# Patient Record
Sex: Male | Born: 1975 | Race: White | Hispanic: No | Marital: Married | Smoking: Never smoker
Health system: Southern US, Community
[De-identification: ages and names within clinical notes are randomized; demographics above are authoritative.]

## PROBLEM LIST (undated history)

## (undated) DIAGNOSIS — F909 Attention-deficit hyperactivity disorder, unspecified type: Secondary | ICD-10-CM

## (undated) DIAGNOSIS — J40 Bronchitis, not specified as acute or chronic: Secondary | ICD-10-CM

## (undated) DIAGNOSIS — K219 Gastro-esophageal reflux disease without esophagitis: Secondary | ICD-10-CM

## (undated) DIAGNOSIS — N200 Calculus of kidney: Secondary | ICD-10-CM

## (undated) DIAGNOSIS — F419 Anxiety disorder, unspecified: Secondary | ICD-10-CM

## (undated) DIAGNOSIS — Z87442 Personal history of urinary calculi: Secondary | ICD-10-CM

## (undated) HISTORY — DX: Gastro-esophageal reflux disease without esophagitis: K21.9

## (undated) HISTORY — PX: BELPHAROPTOSIS REPAIR: SHX369

---

## 2008-09-03 ENCOUNTER — Emergency Department: Payer: Self-pay | Admitting: Emergency Medicine

## 2008-10-17 ENCOUNTER — Emergency Department: Payer: Self-pay | Admitting: Emergency Medicine

## 2011-09-22 HISTORY — PX: EXTRACORPOREAL SHOCK WAVE LITHOTRIPSY: SHX1557

## 2013-12-11 ENCOUNTER — Ambulatory Visit: Payer: Self-pay | Admitting: Physician Assistant

## 2013-12-11 LAB — URINALYSIS, COMPLETE
BILIRUBIN, UR: NEGATIVE
Bacteria: NEGATIVE
GLUCOSE, UR: NEGATIVE mg/dL (ref 0–75)
Ketone: NEGATIVE
LEUKOCYTE ESTERASE: NEGATIVE
Nitrite: NEGATIVE
PH: 7 (ref 4.5–8.0)
Protein: 30
SQUAMOUS EPITHELIAL: NONE SEEN
Specific Gravity: 1.02 (ref 1.003–1.030)
WBC UR: NONE SEEN /HPF (ref 0–5)

## 2013-12-14 ENCOUNTER — Ambulatory Visit: Payer: Self-pay | Admitting: Urology

## 2015-01-09 ENCOUNTER — Ambulatory Visit
Admit: 2015-01-09 | Disposition: A | Discharge status: Home / Self Care | Payer: Self-pay | Attending: Family Medicine | Admitting: Family Medicine

## 2015-01-09 LAB — URINALYSIS, COMPLETE: Bacteria: NEGATIVE

## 2015-01-11 LAB — URINE CULTURE

## 2015-03-27 IMAGING — CT CT STONE STUDY
2 of 4 series · 17 of 46 positions shown, 19 images · non-contrast
Comparison: 09/03/2008 CT

CLINICAL DATA: 37-year-old male with pelvic and scrotal pain.

EXAM:
CT ABDOMEN AND PELVIS WITHOUT CONTRAST
TECHNIQUE: Multidetector CT imaging of the abdomen and pelvis was performed
following the standard protocol without IV contrast.

[Series 2: soft tissue · axial · 0.79mm/px · z∈[-1050,-570]mm · 14 of 106 slices shown, 16 images]
[im 5/106  soft-tissue]
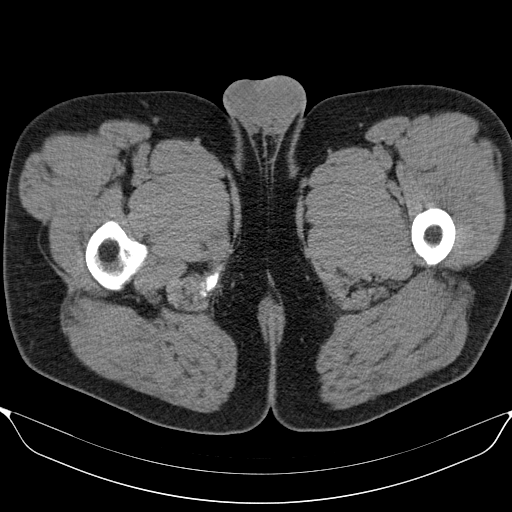
[im 5/106  bone]
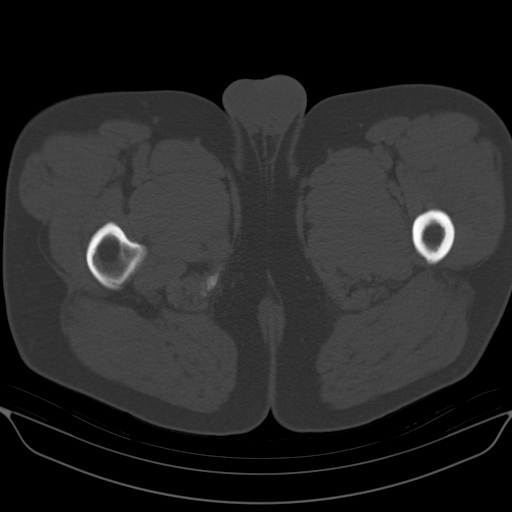
[im 13/106  soft-tissue]
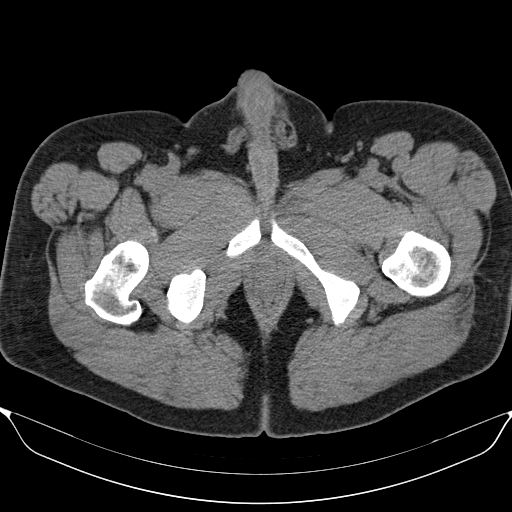
[im 22/106  soft-tissue]
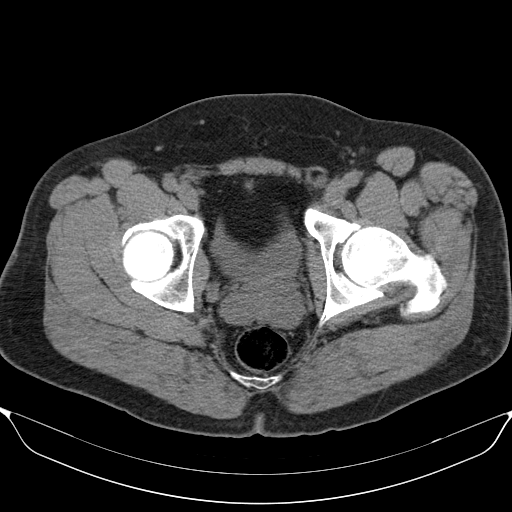
[im 30/106  soft-tissue]
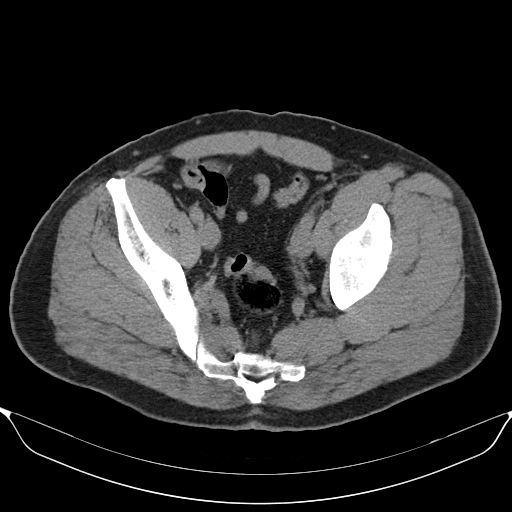
[im 34/106  soft-tissue]
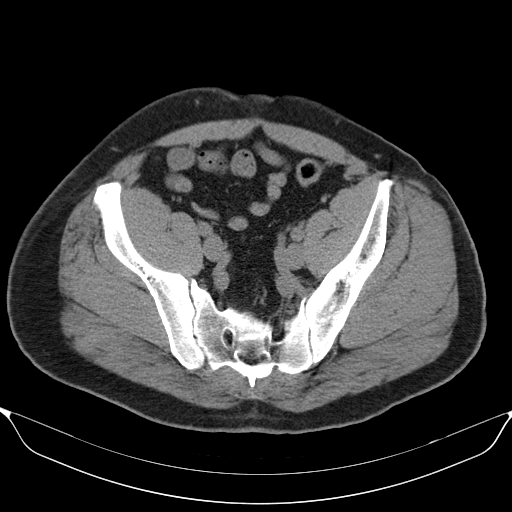
[im 43/106  soft-tissue]
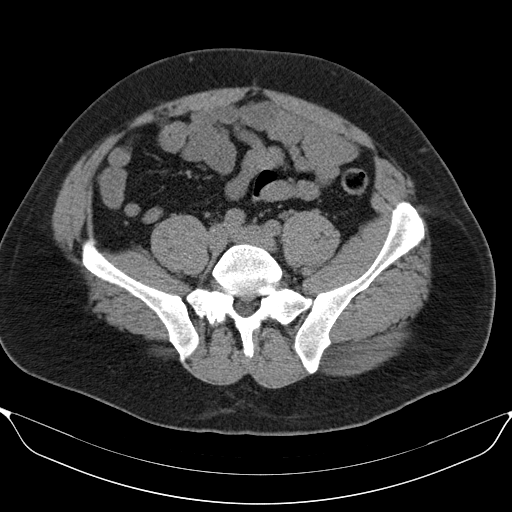
[im 51/106  soft-tissue]
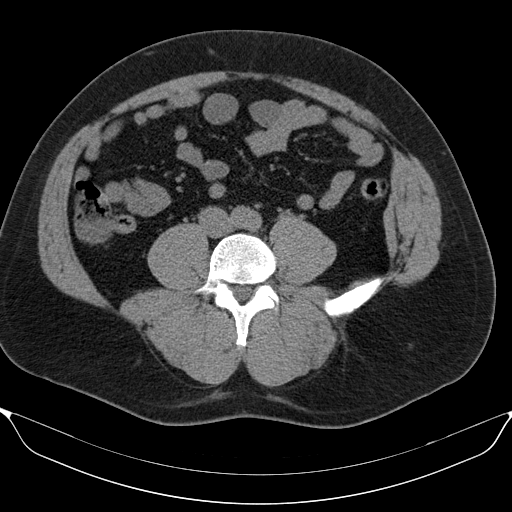
[im 55/106  soft-tissue]
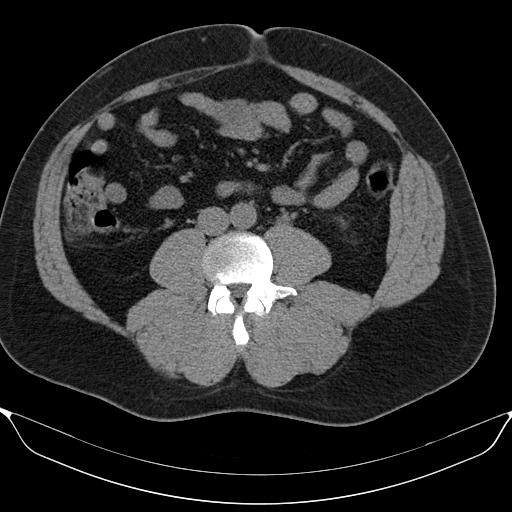
[im 64/106  soft-tissue]
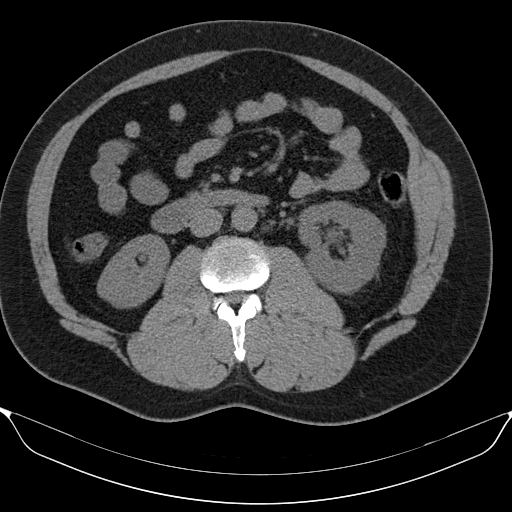
[im 64/106  bone]
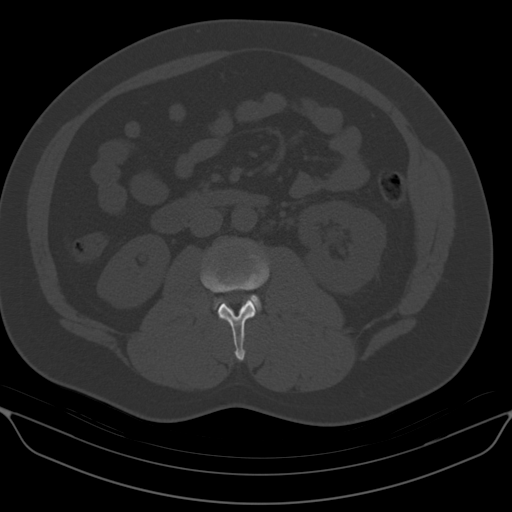
[im 72/106  soft-tissue]
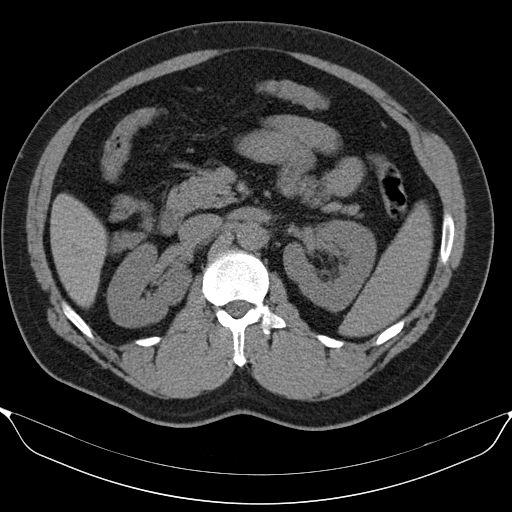
[im 80/106  soft-tissue]
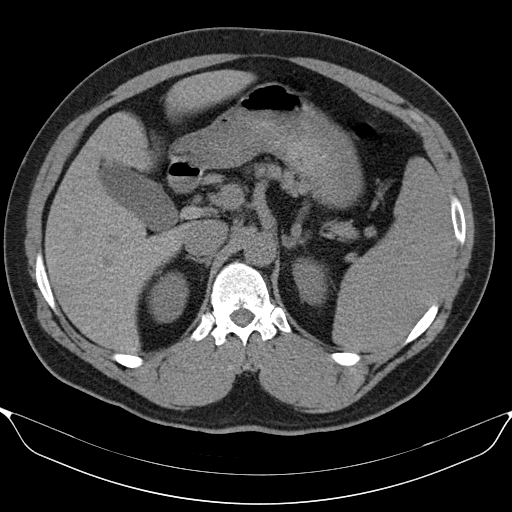
[im 85/106  soft-tissue]
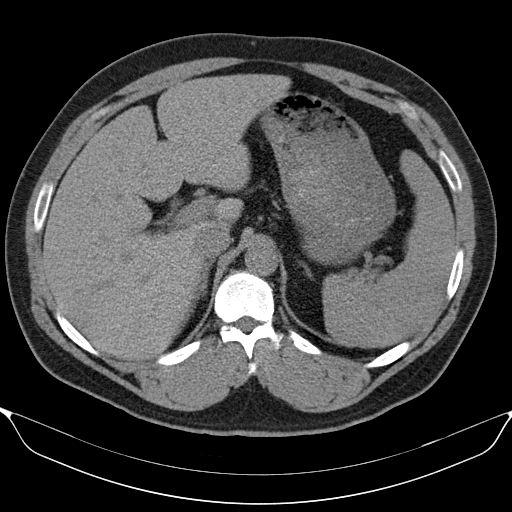
[im 93/106  soft-tissue]
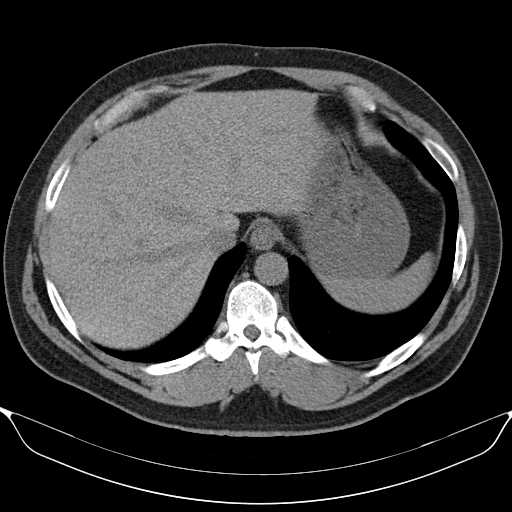
[im 101/106  soft-tissue]
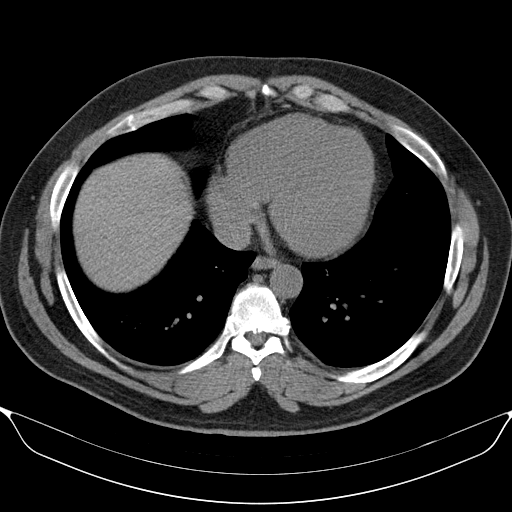

[Series 602: coronal · coronal · 1.02mm/px · 3 of 128 slices shown]
[im 43/128  soft-tissue]
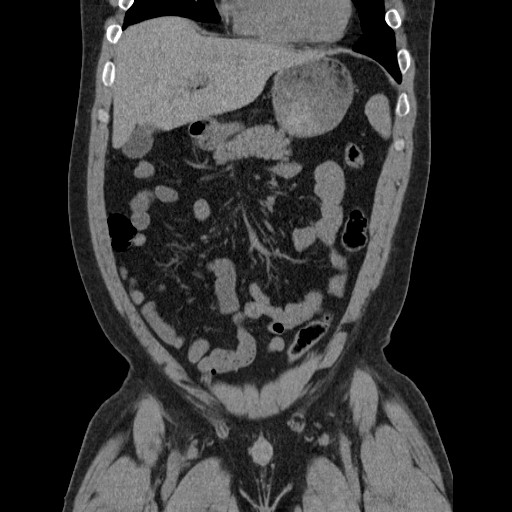
[im 57/128  soft-tissue]
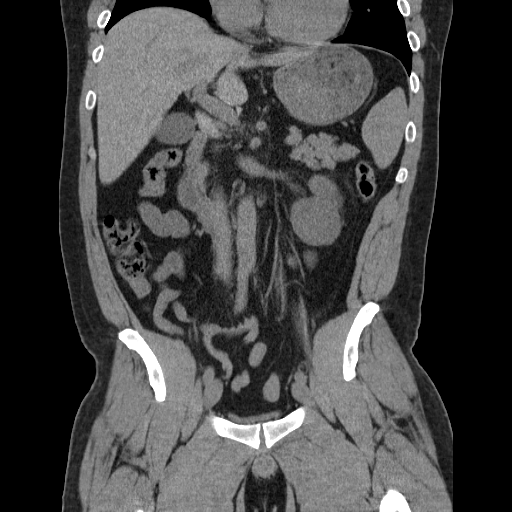
[im 71/128  soft-tissue]
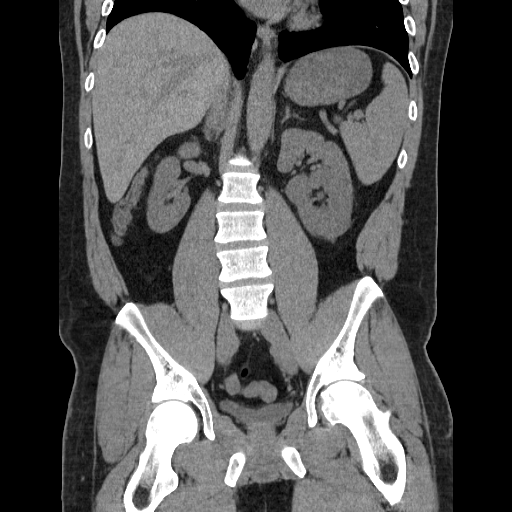

[17 of 46 positions shown; findings below may reference images not displayed]

FINDINGS: A 4 mm left UVJ calculus causes mild left hydroureteronephrosis.

The liver, adrenal glands, gallbladder, pancreas and right kidney
are unremarkable.

Mild splenomegaly again identified.

Please note that parenchymal abnormalities may be missed without
intravenous contrast.

The bowel, bladder and appendix are unremarkable. There is no
evidence of bowel obstruction, abscess or pneumoperitoneum.

No acute or suspicious bony abnormalities are noted. A mild apex
right lumbar scoliosis is present.
IMPRESSION: 4 mm left UVJ calculus causing mild left hydroureteronephrosis.

Unchanged mild splenomegaly.  .

## 2017-02-22 ENCOUNTER — Ambulatory Visit
Admission: EM | Admit: 2017-02-22 | Discharge: 2017-02-22 | Disposition: A | Discharge status: Home / Self Care | Payer: BLUE CROSS/BLUE SHIELD | Attending: Family Medicine | Admitting: Family Medicine

## 2017-02-22 DIAGNOSIS — R112 Nausea with vomiting, unspecified: Secondary | ICD-10-CM

## 2017-02-22 DIAGNOSIS — R197 Diarrhea, unspecified: Secondary | ICD-10-CM

## 2017-02-22 HISTORY — DX: Calculus of kidney: N20.0

## 2017-02-22 LAB — CBC WITH DIFFERENTIAL/PLATELET
BASOS ABS: 0.1 10*3/uL (ref 0–0.1)
Basophils Relative: 1 %
EOS ABS: 0.5 10*3/uL (ref 0–0.7)
EOS PCT: 5 %
HCT: 47.5 % (ref 40.0–52.0)
Hemoglobin: 16.6 g/dL (ref 13.0–18.0)
LYMPHS PCT: 11 %
Lymphs Abs: 1.1 10*3/uL (ref 1.0–3.6)
MCH: 33.9 pg (ref 26.0–34.0)
MCHC: 35 g/dL (ref 32.0–36.0)
MCV: 96.7 fL (ref 80.0–100.0)
Monocytes Absolute: 0.6 10*3/uL (ref 0.2–1.0)
Monocytes Relative: 6 %
Neutro Abs: 7.6 10*3/uL — ABNORMAL HIGH (ref 1.4–6.5)
Neutrophils Relative %: 77 %
PLATELETS: 180 10*3/uL (ref 150–440)
RBC: 4.91 MIL/uL (ref 4.40–5.90)
RDW: 13.1 % (ref 11.5–14.5)
WBC: 9.9 10*3/uL (ref 3.8–10.6)

## 2017-02-22 LAB — BASIC METABOLIC PANEL
ANION GAP: 7 (ref 5–15)
BUN: 14 mg/dL (ref 6–20)
CO2: 21 mmol/L — ABNORMAL LOW (ref 22–32)
Calcium: 9.3 mg/dL (ref 8.9–10.3)
Chloride: 105 mmol/L (ref 101–111)
Creatinine, Ser: 0.82 mg/dL (ref 0.61–1.24)
GFR calc Af Amer: >60 mL/min (ref >60)
Glucose, Bld: 98 mg/dL (ref 65–99)
POTASSIUM: 3.7 mmol/L (ref 3.5–5.1)
SODIUM: 133 mmol/L — AB (ref 135–145)

## 2017-02-22 LAB — C DIFFICILE QUICK SCREEN W PCR REFLEX
C DIFFICILE (CDIFF) TOXIN: NEGATIVE
C Diff antigen: NEGATIVE
C Diff interpretation: NOT DETECTED

## 2017-02-22 LAB — GASTROINTESTINAL PANEL BY PCR, STOOL (REPLACES STOOL CULTURE)
Adenovirus F40/41: NOT DETECTED
Astrovirus: NOT DETECTED
CAMPYLOBACTER SPECIES: NOT DETECTED
CRYPTOSPORIDIUM: NOT DETECTED
Cyclospora cayetanensis: NOT DETECTED
ENTEROPATHOGENIC E COLI (EPEC): NOT DETECTED
Entamoeba histolytica: NOT DETECTED
Enteroaggregative E coli (EAEC): NOT DETECTED
Enterotoxigenic E coli (ETEC): NOT DETECTED
Giardia lamblia: NOT DETECTED
Norovirus GI/GII: NOT DETECTED
PLESIMONAS SHIGELLOIDES: NOT DETECTED
ROTAVIRUS A: NOT DETECTED
SALMONELLA SPECIES: NOT DETECTED
SHIGELLA/ENTEROINVASIVE E COLI (EIEC): NOT DETECTED
Sapovirus (I, II, IV, and V): DETECTED — AB
Shiga like toxin producing E coli (STEC): NOT DETECTED
Vibrio cholerae: NOT DETECTED
Vibrio species: NOT DETECTED
YERSINIA ENTEROCOLITICA: NOT DETECTED

## 2017-02-22 MED ORDER — ONDANSETRON 4 MG PO TBDP: 4.0000 mg | ORAL_TABLET | Freq: Three times a day (TID) | ORAL | 0 refills | Status: DC | PRN

## 2017-02-22 NOTE — ED Provider Notes (Signed)
[facility] ____________________________________________  Time seen: Approximately 1:56 PM  I have reviewed the triage vital signs and the nursing notes.   HISTORY  Chief Complaint Nausea and Diarrhea   HPI Kenneth Gibson is a 41 y.o. male presents for evaluation of 2 months of intermittent diarrhea and nausea. Patient reports he has only vomited twice total. Patient states these episodes come and go. Patient states that these episodes do not last longer than 24 hours and he has had 4-5 episodes of this. Patient states that he is currently in an episode and has had multiple episodes of diarrhea today and some nausea. Denies any vomiting currently. Reports abdominal bubbly sensation but denies abdominal pain. Denies any abnormal colored stool, hematochezia, melena or other abnormal appearance. Denies blood in stool. Denies abdominal pain. Reports he has had one episode of having some chills and bodyaches, but denies any known fevers. Has not tried any over-the-counter medications for the same complaints.  Patient further reports that his wife and his daughter have also been having this same complaints in the same timeframe that come and go. Patient states that his youngest daughter is 78-month-old does not drink any household water and she has been asymptomatic. Patient states that this is raise concern that he may have the issue with his water and is currently having his water tested. Denies any recent sickness or antibiotic use. Denies any sore throat, cough or congestion. Reports continues to eat and drink well otherwise. Reports normal appetite. Denies worms in stool. Denies any other known triggers. Reports otherwise feels well.  Denies chest pain, shortness of breath, abdominal pain, dysuria, extremity pain, extremity swelling or rash. Denies recent sickness. Denies recent antibiotic use.     Past Medical History:  Diagnosis Date  . Kidney stones     There are  no active problems to display for this patient.   History reviewed. No pertinent surgical history.   No current facility-administered medications for this encounter.   Current Outpatient Prescriptions:  .  ondansetron (ZOFRAN ODT) 4 MG disintegrating tablet, Take 1 tablet (4 mg total) by mouth every 8 (eight) hours as needed., Disp: 15 tablet, Rfl: 0   Allergies Penicillins  History reviewed. No pertinent family history.  Social History Social History  Substance Use Topics  . Smoking status: Never Smoker  . Smokeless tobacco: Current User    Types: Snuff  . Alcohol use Yes     Comment: social    Review of Systems Constitutional: As above.  Eyes: No visual changes. ENT: No sore throat. Cardiovascular: Denies chest pain. Respiratory: Denies shortness of breath. Gastrointestinal: As above.  Genitourinary: Negative for dysuria. Musculoskeletal: Negative for back pain. Skin: Negative for rash.   ____________________________________________   PHYSICAL EXAM:  VITAL SIGNS: ED Triage Vitals  Enc Vitals Group     BP 02/22/17 1315 103/65     Pulse Rate 02/22/17 1315 86     Resp 02/22/17 1315 18     Temp 02/22/17 1315 98.4 F (36.9 C)     Temp Source 02/22/17 1315 Oral     SpO2 02/22/17 1315 99 %     Weight 02/22/17 1316 238 lb (108 kg)     Height 02/22/17 1316 5\' 9"  (1.753 m)     Head Circumference --      Peak Flow --      Pain Score 02/22/17 1311 5     Pain Loc --      Pain Edu? --  Excl. in GC? --     Constitutional: Alert and oriented. Well appearing and in no acute distress. ENT      Head: Normocephalic and atraumatic.      Mouth/Throat: Mucous membranes are moist.Oropharynx non-erythematous. Cardiovascular: Normal rate, regular rhythm. Grossly normal heart sounds.  Good peripheral circulation. Respiratory: Normal respiratory effort without tachypnea nor retractions. Breath sounds are clear and equal bilaterally. No wheezes, rales,  rhonchi. Gastrointestinal: Soft and nontender. No distention. Normal Bowel sounds. No CVA tenderness. Musculoskeletal: No midline cervical, thoracic or lumbar tenderness to palpation. Neurologic:  Normal speech and language. No gross focal neurologic deficits are appreciated. Speech is normal. No gait instability.  Skin:  Skin is warm, dry. Psychiatric: Mood and affect are normal. Speech and behavior are normal. Patient exhibits appropriate insight and judgment   ___________________________________________   LABS (all labs ordered are listed, but only abnormal results are displayed)  Labs Reviewed  CBC WITH DIFFERENTIAL/PLATELET - Abnormal; Notable for the following:       Result Value   Neutro Abs 7.6 (*)    All other components within normal limits  BASIC METABOLIC PANEL - Abnormal; Notable for the following:    Sodium 133 (*)    CO2 21 (*)    All other components within normal limits  GASTROINTESTINAL PANEL BY PCR, STOOL (REPLACES STOOL CULTURE)  C DIFFICILE QUICK SCREEN W PCR REFLEX   RADIOLOGY  No results found. ____________________________________________   PROCEDURES Procedures   INITIAL IMPRESSION / ASSESSMENT AND PLAN / ED COURSE  Pertinent labs & imaging results that were available during my care of the patient were reviewed by me and considered in my medical decision making (see chart for details).  Well-appearing patient. No acute distress. Presents for the complaints of diarrhea that has been going on intermittently for the last 2 months with other household contacts with similar. Denies any aggravating or alleviating factors. Reports has continued to eat and drink well. Reports slightly nauseated. Discussed with patient, patient appears well will evaluate CBC and BMP, labs reviewed. Will go ahead and evaluate for GI panel and C. difficile. We'll await panel prior to initiating any antibiotic therapy. Will Rx Zofran as needed. Encourage rest, fluids,  over-the-counter Imodium and supportive care. Discussed strict follow-up and return parameters.Discussed indication, risks and benefits of medications with patient.  Discussed follow up with Primary care physician this week. Discussed follow up and return parameters including no resolution or any worsening concerns. Patient verbalized understanding and agreed to plan.   ____________________________________________   FINAL CLINICAL IMPRESSION(S) / ED DIAGNOSES  Final diagnoses:  Diarrhea, unspecified type  Non-intractable vomiting with nausea, unspecified vomiting type     Discharge Medication List as of 02/22/2017  1:57 PM    START taking these medications   Details  ondansetron (ZOFRAN ODT) 4 MG disintegrating tablet Take 1 tablet (4 mg total) by mouth every 8 (eight) hours as needed., Starting Mon 02/22/2017, Normal        Note: This dictation was prepared with Dragon dictation along with smaller phrase technology. Any transcriptional errors that result from this process are unintentional.            Renford DillsMiller, Verdie Wilms, NP 02/22/17 1456

## 2017-02-22 NOTE — Discharge Instructions (Signed)
Take medication as prescribed. Rest. Drink plenty of fluids.  ° °Follow up with your primary care physician this week as needed. Return to Urgent care for new or worsening concerns.  ° °

## 2017-02-22 NOTE — ED Triage Notes (Addendum)
Pt reports for the past 2 months he has had diarrhea, nausea and abd pain (describes a bubbling) which lasts for about a day or two and then goes away, only to return weekly. His wife and daughter have had the same thing. He wonders if there is something in their water. Several loose and watery stools today with nausea, no vomiting (he reports he has vomited in the past with this).

## 2017-02-28 ENCOUNTER — Ambulatory Visit
Admission: EM | Admit: 2017-02-28 | Discharge: 2017-02-28 | Disposition: A | Discharge status: Home / Self Care | Payer: BLUE CROSS/BLUE SHIELD | Attending: Internal Medicine | Admitting: Internal Medicine

## 2017-02-28 ENCOUNTER — Encounter: Payer: Self-pay | Admitting: Gynecology

## 2017-02-28 DIAGNOSIS — W57XXXA Bitten or stung by nonvenomous insect and other nonvenomous arthropods, initial encounter: Secondary | ICD-10-CM | POA: Diagnosis not present

## 2017-02-28 DIAGNOSIS — S81851A Open bite, right lower leg, initial encounter: Secondary | ICD-10-CM

## 2017-02-28 DIAGNOSIS — Z23 Encounter for immunization: Secondary | ICD-10-CM

## 2017-02-28 MED ORDER — TETANUS-DIPHTH-ACELL PERTUSSIS 5-2.5-18.5 LF-MCG/0.5 IM SUSP
0.5000 mL | Freq: Once | INTRAMUSCULAR | Status: AC
  Administered 2017-02-28: 0.5 mL via INTRAMUSCULAR

## 2017-02-28 MED ORDER — SULFAMETHOXAZOLE-TRIMETHOPRIM 800-160 MG PO TABS: 1.0000 | ORAL_TABLET | Freq: Two times a day (BID) | ORAL | 0 refills | Status: DC

## 2017-02-28 MED ORDER — MUPIROCIN 2 % EX OINT: 1.0000 "application " | TOPICAL_OINTMENT | Freq: Three times a day (TID) | CUTANEOUS | 0 refills | Status: DC

## 2017-02-28 NOTE — ED Triage Notes (Signed)
Per patient while taking his trash  out last night got bitten on his right lower leg / ankle. Patient believe that it is a snake bite. Patient also request Tetanus vaccine.

## 2017-02-28 NOTE — ED Provider Notes (Signed)
CSN: [id number]     Arrival date & time 02/28/17  0941 History   First MD Initiated Contact with Patient 02/28/17 1149     Chief Complaint  Patient presents with  . Insect Bite   (Consider location/radiation/quality/duration/timing/severity/associated sxs/prior Treatment) HPI  This 41 year old male who presents with foot swelling and pain after he was bitten last night when he took out his garbage.. About  10:30 11:00 last night he was taking his garbage out and was barefoot at the time. He remembers having something bite his ankle just inferior to the lateral malleolus. He rushed back in the house to find a flashlight but when he returned could not find anything at all. He thinks it may have been a snake, but is not certain. He states that it is hurting to walk on it but his had no systemic complaints. He says he feels fine. Is some swelling of the lateral foot and ankle. There is no tissue breakdown at all. The swelling does not extend above the malleolus. He has 2 small puncture wounds on the side of the foot. There is no induration or erythema no drainage.        Past Medical History:  Diagnosis Date  . Kidney stones    History reviewed. No pertinent surgical history. No family history on file. Social History  Substance Use Topics  . Smoking status: Never Smoker  . Smokeless tobacco: Current User    Types: Snuff  . Alcohol use Yes     Comment: social    Review of Systems  Constitutional: Positive for activity change. Negative for chills, fatigue and fever.  Musculoskeletal: Positive for gait problem.  Skin: Positive for wound. Negative for color change and pallor.  All other systems reviewed and are negative.   Allergies  Penicillins  Home Medications   Prior to Admission medications   Medication Sig Start Date End Date Taking? Authorizing Provider  ondansetron (ZOFRAN ODT) 4 MG disintegrating tablet Take 1 tablet (4 mg total) by mouth every 8 (eight) hours as  needed. 02/22/17  Yes Renford Dills, NP  mupirocin ointment (BACTROBAN) 2 % Apply 1 application topically 3 (three) times daily. 02/28/17   Lutricia Feil, PA-C   Meds Ordered and Administered this Visit   Medications  Tdap (BOOSTRIX) injection 0.5 mL (0.5 mLs Intramuscular Given 02/28/17 1111)    BP 120/74 (BP Location: Left Arm)   Pulse 73   Temp 98.3 F (36.8 C) (Oral)   Resp 16   Wt 238 lb (108 kg)   SpO2 98%   BMI 35.15 kg/m  No data found.   Physical Exam  Constitutional: He is oriented to person, place, and time. He appears well-developed and well-nourished. No distress.  HENT:  Head: Normocephalic and atraumatic.  Eyes: EOM are normal. Pupils are equal, round, and reactive to light. Right eye exhibits no discharge. Left eye exhibits no discharge.  Neck: Normal range of motion.  Musculoskeletal: Normal range of motion. He exhibits edema and tenderness.  Neurological: He is alert and oriented to person, place, and time.  Skin: Skin is warm and dry. Capillary refill takes less than 2 seconds. He is not diaphoretic. No erythema. No pallor.  Psychiatric: He has a normal mood and affect. His behavior is normal. Judgment and thought content normal.  Nursing note and vitals reviewed.       Urgent Care Course     Procedures (including critical care time)  Labs Review Labs Reviewed - No data to  display  Imaging Review No results found.   Visual Acuity Review  Right Eye Distance:   Left Eye Distance:   Bilateral Distance:    Right Eye Near:   Left Eye Near:    Bilateral Near:     Medications  Tdap (BOOSTRIX) injection 0.5 mL (0.5 mLs Intramuscular Given 02/28/17 1111)      MDM   1. Insect bite, initial encounter    Discharge Medication List as of 02/28/2017 11:55 AM    START taking these medications   Details  mupirocin ointment (BACTROBAN) 2 % Apply 1 application topically 3 (three) times daily., Starting Sun 02/28/2017, Normal      Plan: 1.  Test/x-ray results and diagnosis reviewed with patient 2. rx as per orders; risks, benefits, potential side effects reviewed with patient 3. Recommend supportive treatment with Elevation ice. Careful observation of any worsening. If there is any question at all he should call 911 and go to the emergency department. Is been over 12 hours since the alleged bite. There is no tissue breakdown the swelling is very localized to the foot and ankle. He is hemodynamically stable. Patient does not have any pain or does not feel badly at all. I have told him that today he should elevate and ice it all of today most of tomorrow. I've given him Bactroban for topical application to prevent superficial infection. He knows to contact 911 if there is any change in his status. 4. F/u prn if symptoms worsen or don't improve     Lutricia FeilRoemer, Levell Tavano P, PA-C 02/28/17 1204    Lutricia Feiloemer, Omeka Holben P, PA-C 02/28/17 1205

## 2020-10-14 ENCOUNTER — Other Ambulatory Visit
Admission: RE | Admit: 2020-10-14 | Discharge: 2020-10-14 | Disposition: A | Discharge status: Home / Self Care | Payer: BLUE CROSS/BLUE SHIELD | Source: Ambulatory Visit | Attending: Specialist | Admitting: Specialist

## 2020-10-14 ENCOUNTER — Other Ambulatory Visit: Payer: Self-pay | Admitting: Specialist

## 2020-10-14 DIAGNOSIS — Z01812 Encounter for preprocedural laboratory examination: Secondary | ICD-10-CM | POA: Insufficient documentation

## 2020-10-14 DIAGNOSIS — Z880 Allergy status to penicillin: Secondary | ICD-10-CM | POA: Diagnosis not present

## 2020-10-14 DIAGNOSIS — Z20822 Contact with and (suspected) exposure to covid-19: Secondary | ICD-10-CM | POA: Insufficient documentation

## 2020-10-14 DIAGNOSIS — M67432 Ganglion, left wrist: Secondary | ICD-10-CM | POA: Diagnosis not present

## 2020-10-14 HISTORY — DX: Anxiety disorder, unspecified: F41.9

## 2020-10-14 HISTORY — DX: Attention-deficit hyperactivity disorder, unspecified type: F90.9

## 2020-10-14 HISTORY — DX: Personal history of urinary calculi: Z87.442

## 2020-10-14 NOTE — Patient Instructions (Addendum)
Your procedure is scheduled on: 10/17/20 Report to the Registration Desk on the 1st floor of the [facility]. To find out your arrival time, please call [phone] between 1PM - 3PM on: 10/16/20  REMEMBER: Instructions that are not followed completely may result in serious medical risk, up to and including death; or upon the discretion of your surgeon and anesthesiologist your surgery may need to be rescheduled.  Do not eat food after midnight the night before surgery.  No gum chewing, lozengers or hard candies.  You may however, drink CLEAR liquids up to 2 hours before you are scheduled to arrive for your surgery. Do not drink anything within 2 hours of your scheduled arrival time.  Clear liquids include: - water  - apple juice without pulp - gatorade (not RED, PURPLE, OR BLUE) - black coffee or tea (Do NOT add milk or creamers to the coffee or tea) Do NOT drink anything that is not on this list.   TAKE THESE MEDICATIONS THE MORNING OF SURGERY WITH A SIP OF WATER: NA  One week prior to surgery: Stop Anti-inflammatories (NSAIDS) such as Advil, Aleve, Ibuprofen, Motrin, Naproxen, Naprosyn and Aspirin based products such as Excedrin, Goodys Powder, BC Powder. Stop ANY OVER THE COUNTER supplements until after surgery. CBD GUMMIES (However, you may continue taking Vitamin D, Vitamin B, and multivitamin up until the day before surgery.)  No Alcohol for 24 hours before or after surgery.  No Smoking including e-cigarettes for 24 hours prior to surgery.  No chewable tobacco products for at least 6 hours prior to surgery.  No nicotine patches on the day of surgery.  Do not use any "recreational" drugs for at least a week prior to your surgery.  Please be advised that the combination of cocaine and anesthesia may have negative outcomes, up to and including death. If you test positive for cocaine, your surgery will be cancelled.  On the morning of surgery brush your teeth with  toothpaste and water, you may rinse your mouth with mouthwash if you wish. Do not swallow any toothpaste or mouthwash.  Do not wear jewelry, make-up, hairpins, clips or nail polish.  Do not wear lotions, powders, or perfumes.   Do not shave body from the neck down 48 hours prior to surgery just in case you cut yourself which could leave a site for infection.  Also, freshly shaved skin may become irritated if using the CHG soap.  Contact lenses, hearing aids and dentures may not be worn into surgery.  Do not bring valuables to the hospital. [facility] is not responsible for any missing/lost belongings or valuables.   Use CHG Soap or wipes as directed on instruction sheet.   Notify your doctor if there is any change in your medical condition (cold, fever, infection).  Wear comfortable clothing (specific to your surgery type) to the hospital.  Plan for stool softeners for home use; pain medications have a tendency to cause constipation. You can also help prevent constipation by eating foods high in fiber such as fruits and vegetables and drinking plenty of fluids as your diet allows.  After surgery, you can help prevent lung complications by doing breathing exercises.  Take deep breaths and cough every 1-2 hours. Your doctor may order a device called an Incentive Spirometer to help you take deep breaths. When coughing or sneezing, hold a pillow firmly against your incision with both hands. This is called "splinting." Doing this helps protect your incision. It also decreases belly discomfort.  If you are being admitted to the hospital overnight, leave your suitcase in the car. After surgery it may be brought to your room.  If you are being discharged the day of surgery, you will not be allowed to drive home. You will need a responsible adult (18 years or older) to drive you home and stay with you that night.   If you are taking public transportation, you will need to have a responsible  adult (18 years or older) with you. Please confirm with your physician that it is acceptable to use public transportation.   Please call the Pre-admissions Testing Dept. at [phone] if you have any questions about these instructions.  Visitation Policy:  Patients undergoing a surgery or procedure may have one family member or support person with them as long as that person is not COVID-19 positive or experiencing its symptoms.  That person may remain in the waiting area during the procedure.  Inpatient Visitation:    Visiting hours are 7 a.m. to 8 p.m. Patients will be allowed one visitor. The visitor may change daily. The visitor must pass COVID-19 screenings, use hand sanitizer when entering and exiting the patient's room and wear a mask at all times, including in the patient's room. Patients must also wear a mask when staff or their visitor are in the room. Masking is required regardless of vaccination status. Systemwide, no visitors 17 or younger.

## 2020-10-14 NOTE — H&P (Signed)
PREOPERATIVE H&P  Chief Complaint: [phone] Ganglion, left wrist volar  HPI: Kenneth Gibson is a 45 y.o. male who presents for preoperative history and physical with a diagnosis of M67.432 Ganglion, left wrist. Symptoms are rated as moderate to severe, and have been worsening.  This is significantly impairing activities of daily living.  He has elected for surgical management.   Past Medical History:  Diagnosis Date  . ADHD   . Anxiety   . History of kidney stones   . Kidney stones    Past Surgical History:  Procedure Laterality Date  . BELPHAROPTOSIS REPAIR     Social History   Socioeconomic History  . Marital status: Married    Spouse name: Not on file  . Number of children: 2  . Years of education: Not on file  . Highest education level: Not on file  Occupational History  . Not on file  Tobacco Use  . Smoking status: Never Smoker  . Smokeless tobacco: Current User    Types: Snuff  Substance and Sexual Activity  . Alcohol use: Yes    Comment: social  . Drug use: No  . Sexual activity: Not on file  Other Topics Concern  . Not on file  Social History Narrative  . Not on file   Social Determinants of Health   Financial Resource Strain: Not on file  Food Insecurity: Not on file  Transportation Needs: Not on file  Physical Activity: Not on file  Stress: Not on file  Social Connections: Not on file   No family history on file. Allergies  Allergen Reactions  . Penicillins Other (See Comments)    Unknown reaction. Had as a child   Prior to Admission medications   Not on File     Positive ROS: All other systems have been reviewed and were otherwise negative with the exception of those mentioned in the HPI and as above.  Physical Exam: General: Alert, no acute distress Cardiovascular: No pedal edema. Heart is regular and without murmur.  Respiratory: No cyanosis, no use of accessory musculature. Lungs are clear. GI: No organomegaly, abdomen is soft  and non-tender Skin: No lesions in the area of chief complaint Neurologic: Sensation intact distally Psychiatric: Patient is competent for consent with normal mood and affect Lymphatic: No axillary or cervical lymphadenopathy  MUSCULOSKELETAL: volar ganglion left wrist over radial artery.  Tender to touch.  Mobile.  CSM ggod distally and skin intact.  Assessment: M67.432 Ganglion, left wrist  Plan: Plan for Procedure(s): REMOVAL GANGLION OF WRIST  The risks benefits and alternatives were discussed with the patient including but not limited to the risks of nonoperative treatment, versus surgical intervention including infection, bleeding, nerve injury,  blood clots, cardiopulmonary complications, morbidity, mortality, among others, and they were willing to proceed.   Valinda Hoar, MD [phone]   10/14/2020 12:07 PM

## 2020-10-15 ENCOUNTER — Other Ambulatory Visit: Payer: Self-pay

## 2020-10-15 ENCOUNTER — Other Ambulatory Visit
Admission: RE | Admit: 2020-10-15 | Discharge: 2020-10-15 | Disposition: A | Discharge status: Home / Self Care | Payer: BLUE CROSS/BLUE SHIELD | Source: Ambulatory Visit | Attending: Specialist | Admitting: Specialist

## 2020-10-15 DIAGNOSIS — M67432 Ganglion, left wrist: Secondary | ICD-10-CM | POA: Diagnosis not present

## 2020-10-15 LAB — SARS CORONAVIRUS 2 (TAT 6-24 HRS): SARS Coronavirus 2: NEGATIVE

## 2020-10-17 ENCOUNTER — Encounter
Admission: RE | Disposition: A | Discharge status: Home / Self Care | Payer: Self-pay | Source: Home / Self Care | Attending: Specialist

## 2020-10-17 ENCOUNTER — Ambulatory Visit: Payer: BLUE CROSS/BLUE SHIELD | Admitting: Urgent Care

## 2020-10-17 ENCOUNTER — Ambulatory Visit
Admission: RE | Admit: 2020-10-17 | Discharge: 2020-10-17 | Disposition: A | Discharge status: Home / Self Care | Payer: BLUE CROSS/BLUE SHIELD | Attending: Specialist | Admitting: Specialist

## 2020-10-17 ENCOUNTER — Encounter: Payer: Self-pay | Admitting: Specialist

## 2020-10-17 ENCOUNTER — Other Ambulatory Visit: Payer: Self-pay

## 2020-10-17 DIAGNOSIS — Z20822 Contact with and (suspected) exposure to covid-19: Secondary | ICD-10-CM | POA: Insufficient documentation

## 2020-10-17 DIAGNOSIS — Z880 Allergy status to penicillin: Secondary | ICD-10-CM | POA: Insufficient documentation

## 2020-10-17 DIAGNOSIS — M67432 Ganglion, left wrist: Secondary | ICD-10-CM | POA: Insufficient documentation

## 2020-10-17 HISTORY — PX: GANGLION CYST EXCISION: SHX1691

## 2020-10-17 SURGERY — EXCISION, GANGLION CYST, WRIST
Anesthesia: General | Site: Wrist | Laterality: Left

## 2020-10-17 MED ORDER — MIDAZOLAM HCL 2 MG/2ML IJ SOLN
INTRAMUSCULAR | Status: DC | PRN
  Administered 2020-10-17: 2 mg via INTRAVENOUS

## 2020-10-17 MED ORDER — FAMOTIDINE 20 MG PO TABS: 20.0000 mg | ORAL_TABLET | Freq: Once | ORAL | Status: AC

## 2020-10-17 MED ORDER — CHLORHEXIDINE GLUCONATE 0.12 % MT SOLN
OROMUCOSAL | Status: AC
  Administered 2020-10-17: 15 mL via OROMUCOSAL
  Filled 2020-10-17: qty 15

## 2020-10-17 MED ORDER — ORAL CARE MOUTH RINSE: 15.0000 mL | Freq: Once | OROMUCOSAL | Status: AC

## 2020-10-17 MED ORDER — GABAPENTIN 300 MG PO CAPS
ORAL_CAPSULE | ORAL | Status: AC
  Administered 2020-10-17: 300 mg via ORAL
  Filled 2020-10-17: qty 1

## 2020-10-17 MED ORDER — FENTANYL CITRATE (PF) 100 MCG/2ML IJ SOLN
INTRAMUSCULAR | Status: AC
  Filled 2020-10-17: qty 2

## 2020-10-17 MED ORDER — ONDANSETRON HCL 4 MG/2ML IJ SOLN
INTRAMUSCULAR | Status: DC | PRN
  Administered 2020-10-17: 4 mg via INTRAVENOUS

## 2020-10-17 MED ORDER — CHLORHEXIDINE GLUCONATE 0.12 % MT SOLN: 15.0000 mL | Freq: Once | OROMUCOSAL | Status: AC

## 2020-10-17 MED ORDER — DEXAMETHASONE SODIUM PHOSPHATE 10 MG/ML IJ SOLN
INTRAMUSCULAR | Status: AC
  Filled 2020-10-17: qty 1

## 2020-10-17 MED ORDER — DEXAMETHASONE SODIUM PHOSPHATE 10 MG/ML IJ SOLN
INTRAMUSCULAR | Status: DC | PRN
  Administered 2020-10-17: 10 mg via INTRAVENOUS
  Administered 2020-10-17: 4 mg via INTRAVENOUS

## 2020-10-17 MED ORDER — LIDOCAINE HCL (PF) 2 % IJ SOLN
INTRAMUSCULAR | Status: AC
  Filled 2020-10-17: qty 5

## 2020-10-17 MED ORDER — MELOXICAM 15 MG PO TABS: 15.0000 mg | ORAL_TABLET | Freq: Every day | ORAL | 3 refills | Status: DC

## 2020-10-17 MED ORDER — LIDOCAINE HCL (CARDIAC) PF 100 MG/5ML IV SOSY
PREFILLED_SYRINGE | INTRAVENOUS | Status: DC | PRN
  Administered 2020-10-17: 100 mg via INTRAVENOUS

## 2020-10-17 MED ORDER — LACTATED RINGERS IV SOLN: INTRAVENOUS | Status: DC

## 2020-10-17 MED ORDER — HYDROCODONE-ACETAMINOPHEN 5-325 MG PO TABS: 1.0000 | ORAL_TABLET | Freq: Four times a day (QID) | ORAL | 0 refills | Status: DC | PRN

## 2020-10-17 MED ORDER — BUPIVACAINE HCL (PF) 0.5 % IJ SOLN
INTRAMUSCULAR | Status: AC
  Filled 2020-10-17: qty 30

## 2020-10-17 MED ORDER — CLINDAMYCIN PHOSPHATE 600 MG/50ML IV SOLN
INTRAVENOUS | Status: AC
  Filled 2020-10-17: qty 50

## 2020-10-17 MED ORDER — CEFAZOLIN SODIUM-DEXTROSE 2-4 GM/100ML-% IV SOLN
2.0000 g | INTRAVENOUS | Status: AC
  Administered 2020-10-17: 2 g via INTRAVENOUS

## 2020-10-17 MED ORDER — CEFAZOLIN SODIUM-DEXTROSE 2-4 GM/100ML-% IV SOLN
INTRAVENOUS | Status: AC
  Filled 2020-10-17: qty 100

## 2020-10-17 MED ORDER — FENTANYL CITRATE (PF) 100 MCG/2ML IJ SOLN
INTRAMUSCULAR | Status: DC | PRN
  Administered 2020-10-17: 50 ug via INTRAVENOUS

## 2020-10-17 MED ORDER — FAMOTIDINE 20 MG PO TABS
ORAL_TABLET | ORAL | Status: AC
  Administered 2020-10-17: 20 mg via ORAL
  Filled 2020-10-17: qty 1

## 2020-10-17 MED ORDER — GABAPENTIN 300 MG PO CAPS: 300.0000 mg | ORAL_CAPSULE | ORAL | Status: AC

## 2020-10-17 MED ORDER — BUPIVACAINE HCL 0.5 % IJ SOLN
INTRAMUSCULAR | Status: DC | PRN
  Administered 2020-10-17: 9 mL

## 2020-10-17 MED ORDER — MELOXICAM 7.5 MG PO TABS: 15.0000 mg | ORAL_TABLET | ORAL | Status: AC

## 2020-10-17 MED ORDER — MIDAZOLAM HCL 2 MG/2ML IJ SOLN
INTRAMUSCULAR | Status: AC
  Filled 2020-10-17: qty 2

## 2020-10-17 MED ORDER — EPHEDRINE SULFATE 50 MG/ML IJ SOLN
INTRAMUSCULAR | Status: DC | PRN
  Administered 2020-10-17: 10 mg via INTRAVENOUS

## 2020-10-17 MED ORDER — MELOXICAM 7.5 MG PO TABS
ORAL_TABLET | ORAL | Status: AC
  Administered 2020-10-17: 15 mg via ORAL
  Filled 2020-10-17: qty 2

## 2020-10-17 MED ORDER — CLINDAMYCIN PHOSPHATE 600 MG/50ML IV SOLN
600.0000 mg | INTRAVENOUS | Status: AC
  Administered 2020-10-17: 600 mg via INTRAVENOUS

## 2020-10-17 MED ORDER — CHLORHEXIDINE GLUCONATE CLOTH 2 % EX PADS: 6.0000 | MEDICATED_PAD | Freq: Once | CUTANEOUS | Status: DC

## 2020-10-17 MED ORDER — GABAPENTIN 400 MG PO CAPS: 400.0000 mg | ORAL_CAPSULE | Freq: Three times a day (TID) | ORAL | 3 refills | Status: DC

## 2020-10-17 MED ORDER — EPHEDRINE 5 MG/ML INJ
INTRAVENOUS | Status: AC
  Filled 2020-10-17: qty 10

## 2020-10-17 MED ORDER — PROPOFOL 10 MG/ML IV BOLUS
INTRAVENOUS | Status: DC | PRN
Start: 2020-10-17 — End: 2020-10-17
  Administered 2020-10-17: 200 mg via INTRAVENOUS

## 2020-10-17 MED ORDER — ONDANSETRON HCL 4 MG/2ML IJ SOLN
INTRAMUSCULAR | Status: AC
  Filled 2020-10-17: qty 2

## 2020-10-17 MED ORDER — PROPOFOL 10 MG/ML IV BOLUS
INTRAVENOUS | Status: AC
  Filled 2020-10-17: qty 40

## 2020-10-17 SURGICAL SUPPLY — 35 items
APL PRP STRL LF DISP 70% ISPRP (MISCELLANEOUS) ×1
BLADE SURG MINI STRL (BLADE) ×2 IMPLANT
BNDG ESMARK 4X12 TAN STRL LF (GAUZE/BANDAGES/DRESSINGS) ×2 IMPLANT
CANISTER SUCT 1200ML W/VALVE (MISCELLANEOUS) ×2 IMPLANT
CHLORAPREP W/TINT 26 (MISCELLANEOUS) ×2 IMPLANT
COVER WAND RF STERILE (DRAPES) ×2 IMPLANT
CUFF TOURN SGL QUICK 18X4 (TOURNIQUET CUFF) ×1 IMPLANT
DRSG GAUZE FLUFF 36X18 (GAUZE/BANDAGES/DRESSINGS) ×3 IMPLANT
ELECT REM PT RETURN 9FT ADLT (ELECTROSURGICAL) ×2
ELECTRODE REM PT RTRN 9FT ADLT (ELECTROSURGICAL) ×1 IMPLANT
GAUZE SPONGE 4X4 12PLY STRL (GAUZE/BANDAGES/DRESSINGS) ×2 IMPLANT
GAUZE XEROFORM 1X8 LF (GAUZE/BANDAGES/DRESSINGS) ×2 IMPLANT
GLOVE SURG ORTHO LTX SZ8 (GLOVE) ×2 IMPLANT
GOWN STRL REUS W/ TWL LRG LVL3 (GOWN DISPOSABLE) ×1 IMPLANT
GOWN STRL REUS W/TWL LRG LVL3 (GOWN DISPOSABLE) ×2
GOWN STRL REUS W/TWL LRG LVL4 (GOWN DISPOSABLE) ×2 IMPLANT
KIT TURNOVER KIT A (KITS) IMPLANT
MANIFOLD NEPTUNE II (INSTRUMENTS) ×2 IMPLANT
NS IRRIG 500ML POUR BTL (IV SOLUTION) ×2 IMPLANT
PACK EXTREMITY ARMC (MISCELLANEOUS) ×2 IMPLANT
PAD CAST CTTN 4X4 STRL (SOFTGOODS) ×1 IMPLANT
PAD PREP 24X41 OB/GYN DISP (PERSONAL CARE ITEMS) ×2 IMPLANT
PADDING CAST COTTON 4X4 STRL (SOFTGOODS) ×2
SPLINT CAST 1 STEP 3X12 (MISCELLANEOUS) ×2 IMPLANT
STOCKINETTE 48X4 2 PLY STRL (GAUZE/BANDAGES/DRESSINGS) ×1 IMPLANT
STOCKINETTE BIAS CUT 4 980044 (GAUZE/BANDAGES/DRESSINGS) ×2 IMPLANT
STOCKINETTE STRL 4IN 9604848 (GAUZE/BANDAGES/DRESSINGS) ×2 IMPLANT
STRAP SAFETY 5IN WIDE (MISCELLANEOUS) ×2 IMPLANT
SUT ETHILON 4-0 (SUTURE) ×2
SUT ETHILON 4-0 FS2 18XMFL BLK (SUTURE) ×1
SUT ETHILON 5-0 (SUTURE) ×2
SUT ETHILON 5-0 C-3 18XMFL BLK (SUTURE) ×1
SUT VIC AB 4-0 FS2 27 (SUTURE) ×2 IMPLANT
SUTURE ETHLN 4-0 FS2 18XMF BLK (SUTURE) ×1 IMPLANT
SUTURE ETHLN 5-0 C3 18XMF BLK (SUTURE) ×1 IMPLANT

## 2020-10-17 NOTE — Anesthesia Procedure Notes (Signed)
Procedure Name: LMA Insertion Date/Time: 10/17/2020 8:21 AM Performed by: Reece Agar, CRNA Pre-anesthesia Checklist: Patient identified, Emergency Drugs available, Suction available and Patient being monitored Patient Re-evaluated:Patient Re-evaluated prior to induction Oxygen Delivery Method: Circle system utilized Preoxygenation: Pre-oxygenation with 100% oxygen Induction Type: IV induction LMA: LMA inserted LMA Size: 5.0 Number of attempts: 1 Placement Confirmation: positive ETCO2 and breath sounds checked- equal and bilateral Tube secured with: Tape Dental Injury: Teeth and Oropharynx as per pre-operative assessment

## 2020-10-17 NOTE — Transfer of Care (Signed)
Immediate Anesthesia Transfer of Care Note  Patient: Kenneth Gibson  Procedure(s) Performed: REMOVAL GANGLION OF WRIST (Left Wrist)  Patient Location: PACU  Anesthesia Type:General  Level of Consciousness: sedated  Airway & Oxygen Therapy: Patient Spontanous Breathing and Patient connected to face mask  Post-op Assessment: Report given to RN and Post -op Vital signs reviewed and stable  Post vital signs: Reviewed and stable  Last Vitals:  Vitals Value Taken Time  BP 111/63 10/17/20 0853  Temp 36.2 C 10/17/20 0855  Pulse 67 10/17/20 0859  Resp 12 10/17/20 0859  SpO2 98 % 10/17/20 0859  Vitals shown include unvalidated device data.  Last Pain:  Vitals:   10/17/20 0648  TempSrc: Oral  PainSc: 0-No pain         Complications: No complications documented.

## 2020-10-17 NOTE — Op Note (Signed)
10/17/2020  8:50 AM  PATIENT:  Kenneth Gibson    PRE-OPERATIVE DIAGNOSIS:  L24.401 Ganglion, left wrist  POST-OPERATIVE DIAGNOSIS:  Same  PROCEDURE:  REMOVAL GANGLION OF WRIST  SURGEON:  Valinda Hoar, MD  ANESTHESIA:   General  TOURNIQUET TIME: 21  MIN  PREOPERATIVE INDICATIONS:  Kenneth Gibson is a  45 y.o. male with a diagnosis of M67.432 Ganglion, left wrist who failed conservative measures and elected for surgical management.    The risks benefits and alternatives were discussed with the patient preoperatively including but not limited to the risks of infection, bleeding, nerve injury, cardiopulmonary complications, the need for revision surgery, among others, and the patient was willing to proceed.   OPERATIVE FINDINGS: Deep volar ganglion.  Artery intact  .  OPERATIVE PROCEDURE: The patient was brought to the operating room where they underwent satisfactory general LMA anesthesia.  The  arm was prepped and draped in sterile fashion.  Esmarch was applied and tourniquet inflated to 250 mmHg.  A short transverse incision was made over the volar radial aspect of the  wrist.  Dissection was carried out carefully under loupe magnification with fine scissors.  The ganglion was quickly evident and dissection was carried out around it in a careful manner.  The radial artery was identified and freed up from adhesions.  It was retracted radially.  The ganglion was followed down to the wrist joint and was seen to exit through the volar capsule. The volar capsule was debrided.  The entire ganglion was removed.  Wound was irrigated and subcutaneous change tissue closed with 3-0 Vicryl and the skin with 4-0 nylon.  Sponge and needle counts were correct.  1/2%  Marcaine was placed in the wound.  A dry sterile compression hand dressing with volar splint was applied.  Tourniquet was deflated with good return of blood flow to the hand.  The patient was awakened and taken to recovery  in good condition.    Valinda Hoar, M.D.

## 2020-10-17 NOTE — Anesthesia Preprocedure Evaluation (Signed)
Anesthesia Evaluation  Patient identified by MRN, date of birth, ID band Patient awake    Reviewed: Allergy & Precautions, H&P , NPO status , Patient's Chart, lab work & pertinent test results, reviewed documented beta blocker date and time   Airway Mallampati: II  TM Distance: >3 FB Neck ROM: full    Dental  (+) Dental Advidsory Given, Chipped, Teeth Intact   Pulmonary neg pulmonary ROS,    Pulmonary exam normal breath sounds clear to auscultation       Cardiovascular Exercise Tolerance: Good negative cardio ROS Normal cardiovascular exam Rhythm:regular Rate:Normal     Neuro/Psych PSYCHIATRIC DISORDERS Anxiety negative neurological ROS     GI/Hepatic negative GI ROS, Neg liver ROS,   Endo/Other  negative endocrine ROS  Renal/GU Renal disease (kidney stones)  negative genitourinary   Musculoskeletal   Abdominal   Peds  Hematology negative hematology ROS (+)   Anesthesia Other Findings Past Medical History: No date: ADHD No date: Anxiety No date: History of kidney stones No date: Kidney stones   Reproductive/Obstetrics negative OB ROS                             Anesthesia Physical Anesthesia Plan  ASA: II  Anesthesia Plan: General   Post-op Pain Management:    Induction:   PONV Risk Score and Plan:   Airway Management Planned:   Additional Equipment:   Intra-op Plan:   Post-operative Plan:   Informed Consent: I have reviewed the patients History and Physical, chart, labs and discussed the procedure including the risks, benefits and alternatives for the proposed anesthesia with the patient or authorized representative who has indicated his/her understanding and acceptance.     Dental Advisory Given  Plan Discussed with: Anesthesiologist, CRNA and Surgeon  Anesthesia Plan Comments:         Anesthesia Quick Evaluation

## 2020-10-17 NOTE — H&P (Signed)
THE PATIENT WAS SEEN PRIOR TO SURGERY TODAY.  HISTORY, ALLERGIES, HOME MEDICATIONS AND OPERATIVE PROCEDURE WERE REVIEWED. RISKS AND BENEFITS OF SURGERY DISCUSSED WITH PATIENT AGAIN.  NO CHANGES FROM INITIAL HISTORY AND PHYSICAL NOTED.    

## 2020-10-17 NOTE — Anesthesia Postprocedure Evaluation (Signed)
Anesthesia Post Note  Patient: Kenneth Gibson  Procedure(s) Performed: REMOVAL GANGLION OF WRIST (Left Wrist)  Patient location during evaluation: PACU Anesthesia Type: General Level of consciousness: awake and alert Pain management: pain level controlled Vital Signs Assessment: post-procedure vital signs reviewed and stable Respiratory status: spontaneous breathing, nonlabored ventilation, respiratory function stable and patient connected to nasal cannula oxygen Cardiovascular status: blood pressure returned to baseline and stable Postop Assessment: no apparent nausea or vomiting Anesthetic complications: no   No complications documented.   Last Vitals:  Vitals:   10/17/20 0927 10/17/20 0940  BP: 127/81 122/79  Pulse: 81 71  Resp: 17 16  Temp: (!) 36.1 C (!) 36 C  SpO2: 98% 100%    Last Pain:  Vitals:   10/17/20 0940  TempSrc: Temporal  PainSc: 0-No pain                 Lenard Simmer

## 2020-10-17 NOTE — Discharge Instructions (Signed)

## 2020-10-18 LAB — SURGICAL PATHOLOGY

## 2023-02-08 NOTE — Progress Notes (Deleted)
Cardiology Office Note:    Date:  02/08/2023   ID:  Kenneth Gibson, DOB July 08, 1976, MRN [id number]  PCP:  Marguarite Arbour, MD   [facility] Providers Cardiologist:  None { Click to update primary MD,subspecialty MD or APP then REFRESH:1}    Referring MD: Marguarite Arbour, MD   No chief complaint on file. ***  History of Present Illness:    Kenneth Gibson is a 47 y.o. male is self referred for evaluation of chest pain. He has a history of HTN and anxiety.   Past Medical History:  Diagnosis Date   ADHD    Anxiety    History of kidney stones    Kidney stones     Past Surgical History:  Procedure Laterality Date   BELPHAROPTOSIS REPAIR     GANGLION CYST EXCISION Left 10/17/2020   Procedure: REMOVAL GANGLION OF WRIST;  Surgeon: Deeann Saint, MD;  Location: [facility] ORS;  Service: Orthopedics;  Laterality: Left;    Current Medications: No outpatient medications have been marked as taking for the 02/09/23 encounter (Appointment) with [place], Jamond Neels M, MD.     Allergies:   Penicillins   Social History   Socioeconomic History   Marital status: Married    Spouse name: Not on file   Number of children: 2   Years of education: Not on file   Highest education level: Not on file  Occupational History   Not on file  Tobacco Use   Smoking status: Never   Smokeless tobacco: Current    Types: Snuff  Substance and Sexual Activity   Alcohol use: Yes    Comment: social   Drug use: No   Sexual activity: Not on file  Other Topics Concern   Not on file  Social History Narrative   Not on file   Social Determinants of Health   Financial Resource Strain: Not on file  Food Insecurity: Not on file  Transportation Needs: Not on file  Physical Activity: Not on file  Stress: Not on file  Social Connections: Not on file     Family History: The patient's ***family history is not on file.  ROS:   Please see the history of present illness.     *** All other systems reviewed and are negative.  EKGs/Labs/Other Studies Reviewed:    The following studies were reviewed today: ***  EKG:  EKG is *** ordered today.  The ekg ordered today demonstrates ***  Recent Labs: No results found for requested labs within last 365 days.  Recent Lipid Panel No results found for: "CHOL", "TRIG", "HDL", "CHOLHDL", "VLDL", "LDLCALC", "LDLDIRECT" Dated 02/05/23: CBC is normal. Glucose 121, potassium 3.5, AST 51, ALT 129, otherwise CMET normal. PSA and UA normal. Cholesterol total 144.   Risk Assessment/Calculations:   {Does this patient have ATRIAL FIBRILLATION?:[phone]}  No BP recorded.  {Refresh Note OR Click here to enter BP  :1}***         Physical Exam:    VS:  There were no vitals taken for this visit.    Wt Readings from Last 3 Encounters:  10/17/20 233 lb (105.7 kg)  02/28/17 238 lb (108 kg)  02/22/17 238 lb (108 kg)     GEN: *** Well nourished, well developed in no acute distress HEENT: Normal NECK: No JVD; No carotid bruits LYMPHATICS: No lymphadenopathy CARDIAC: ***RRR, no murmurs, rubs, gallops RESPIRATORY:  Clear to auscultation without rales, wheezing or rhonchi  ABDOMEN: Soft, non-tender, non-distended MUSCULOSKELETAL:  No  edema; No deformity  SKIN: Warm and dry NEUROLOGIC:  Alert and oriented x 3 PSYCHIATRIC:  Normal affect   ASSESSMENT:    No diagnosis found. PLAN:    In order of problems listed above:  ***      {Are you ordering a CV Procedure (e.g. stress test, cath, DCCV, TEE, etc)?   Press F2        :[id number]}    Medication Adjustments/Labs and Tests Ordered: Current medicines are reviewed at length with the patient today.  Concerns regarding medicines are outlined above.  No orders of the defined types were placed in this encounter.  No orders of the defined types were placed in this encounter.   There are no Patient Instructions on file for this visit.   Signed, Gregery Walberg [place], MD   02/08/2023 3:03 PM    [facility]

## 2023-02-09 ENCOUNTER — Ambulatory Visit: Payer: BLUE CROSS/BLUE SHIELD | Attending: Cardiology | Admitting: Cardiology

## 2023-04-29 ENCOUNTER — Ambulatory Visit: Payer: BLUE CROSS/BLUE SHIELD | Admitting: Cardiology

## 2023-05-28 ENCOUNTER — Other Ambulatory Visit: Payer: Self-pay | Admitting: Gastroenterology

## 2023-05-28 DIAGNOSIS — R7989 Other specified abnormal findings of blood chemistry: Secondary | ICD-10-CM

## 2023-06-03 ENCOUNTER — Ambulatory Visit
Admission: RE | Admit: 2023-06-03 | Discharge: 2023-06-03 | Disposition: A | Discharge status: Home / Self Care | Payer: 59 | Source: Ambulatory Visit | Attending: Gastroenterology | Admitting: Gastroenterology

## 2023-06-03 DIAGNOSIS — R7989 Other specified abnormal findings of blood chemistry: Secondary | ICD-10-CM

## 2023-06-18 ENCOUNTER — Ambulatory Visit: Payer: 59

## 2023-06-18 DIAGNOSIS — K640 First degree hemorrhoids: Secondary | ICD-10-CM | POA: Diagnosis not present

## 2023-06-18 DIAGNOSIS — D125 Benign neoplasm of sigmoid colon: Secondary | ICD-10-CM | POA: Diagnosis not present

## 2023-06-18 DIAGNOSIS — Z1211 Encounter for screening for malignant neoplasm of colon: Secondary | ICD-10-CM | POA: Diagnosis present

## 2023-06-18 DIAGNOSIS — K219 Gastro-esophageal reflux disease without esophagitis: Secondary | ICD-10-CM | POA: Diagnosis not present

## 2023-06-29 ENCOUNTER — Other Ambulatory Visit: Payer: 59

## 2023-06-29 ENCOUNTER — Encounter: Payer: Self-pay | Admitting: Oncology

## 2023-06-29 ENCOUNTER — Inpatient Hospital Stay: Payer: 59 | Attending: Oncology | Admitting: Oncology

## 2023-07-02 ENCOUNTER — Encounter: Payer: Self-pay | Admitting: Oncology

## 2023-07-02 ENCOUNTER — Inpatient Hospital Stay: Payer: 59

## 2023-07-02 NOTE — Progress Notes (Addendum)
 Hematology/Oncology Consult note [facility] [phone] [phone]  Patient Care Team: Marguarite Arbour, MD as PCP - General (Internal Medicine)   Name of the patient: Kenneth Gibson  [id number]  May 28, 1976    Reason for referral-new diagnosis of hemochromatosis   Referring physician-Janice Clydene Pugh, [place]  Date of visit: 07/02/23   History of presenting illness- Patient is a 47 year old Caucasian male who was seen by [facility] for evaluation of abnormal LFTs.  His past medical history is significant for ADHD and anxiety.  He has symptoms of epigastric pain and chest wall pain.  He underwent a complete cardiology evaluation including a nuclear stress test which was unremarkable.  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  CMP showed elevated AST and ALT of 92 and 177 respectively with a total normal bilirubin.  Iron studies showed ferritin levels greater than 1500 with an iron saturation of 99%.  Hepatitis B and C testing were negative.  CBC in the past has been normal.  Patient drinks about 1 beer per day  ECOG PS- 0  Pain scale- 0   Review of systems- Review of Systems  Constitutional:  Negative for chills, fever, malaise/fatigue and weight loss.  HENT:  Negative for congestion, ear discharge and nosebleeds.   Eyes:  Negative for blurred vision.  Respiratory:  Negative for cough, hemoptysis, sputum production, shortness of breath and wheezing.   Cardiovascular:  Negative for chest pain, palpitations, orthopnea and claudication.  Gastrointestinal:  Negative for abdominal pain, blood in stool, constipation, diarrhea, heartburn, melena, nausea and vomiting.  Genitourinary:  Negative for dysuria, flank pain, frequency, hematuria and urgency.  Musculoskeletal:  Negative for back pain, joint pain and myalgias.  Skin:  Negative for rash.  Neurological:  Negative for  dizziness, tingling, focal weakness, seizures, weakness and headaches.  Endo/Heme/Allergies:  Does not bruise/bleed easily.  Psychiatric/Behavioral:  Negative for depression and suicidal ideas. The patient does not have insomnia.     Allergies  Allergen Reactions   Penicillins Other (See Comments)    Unknown reaction. Had as a child    Patient Active Problem List   Diagnosis Date Noted   Hereditary hemochromatosis (HCC) 06/29/2023     Past Medical History:  Diagnosis Date   ADHD    Anxiety    GERD (gastroesophageal reflux disease)    History of kidney stones    Kidney stones      Past Surgical History:  Procedure Laterality Date   BELPHAROPTOSIS REPAIR     GANGLION CYST EXCISION Left 10/17/2020   Procedure: REMOVAL GANGLION OF WRIST;  Surgeon: Deeann Saint, MD;  Location: [facility] ORS;  Service: Orthopedics;  Laterality: Left;    Social History   Socioeconomic History   Marital status: Married    Spouse name: Not on file   Number of children: 2   Years of education: Not on file   Highest education level: Not on file  Occupational History   Not on file  Tobacco Use   Smoking status: Never   Smokeless tobacco: Current    Types: Snuff  Substance and Sexual Activity   Alcohol use: Yes    Alcohol/week: 6.0 standard drinks of alcohol    Types: 6 Cans of beer per week    Comment: social   Drug use: No   Sexual activity: Yes  Other Topics Concern   Not on file  Social History Narrative  Not on file   Social Determinants of Health   Financial Resource Strain: Not on file  Food Insecurity: No Food Insecurity (06/29/2023)   Hunger Vital Sign    Worried About Running Out of Food in the Last Year: Never true    Ran Out of Food in the Last Year: Never true  Transportation Needs: No Transportation Needs (06/29/2023)   PRAPARE - Administrator, Civil Service (Medical): No    Lack of Transportation (Non-Medical): No  Physical Activity: Not on file   Stress: Not on file  Social Connections: Not on file  Intimate Partner Violence: Not At Risk (06/29/2023)   Humiliation, Afraid, Rape, and Kick questionnaire    Fear of Current or Ex-Partner: No    Emotionally Abused: No    Physically Abused: No    Sexually Abused: No     History reviewed. No pertinent family history.   Current Outpatient Medications:    alfuzosin (UROXATRAL) 10 MG 24 hr tablet, , Disp: , Rfl:    ALPRAZolam (XANAX) 0.5 MG tablet, Take 1 tablet 30 min prior to your scheduled surgery appointment.  Bring the remaining tablets with you.  Have a designated driver., Disp: , Rfl:    famotidine (PEPCID) 40 MG tablet, Take 1 tablet by mouth at bedtime., Disp: , Rfl:    gabapentin (NEURONTIN) 400 MG capsule, Take 1 capsule (400 mg total) by mouth 3 (three) times daily., Disp: 60 capsule, Rfl: 3   HYDROcodone-acetaminophen (NORCO) 5-325 MG tablet, Take 1-2 tablets by mouth every 6 (six) hours as needed., Disp: 50 tablet, Rfl: 0   levofloxacin (LEVAQUIN) 500 MG tablet, , Disp: , Rfl:    meloxicam (MOBIC) 15 MG tablet, Take 1 tablet (15 mg total) by mouth daily., Disp: 30 tablet, Rfl: 3   Physical exam:  Vitals:   06/29/23 1400  BP: (!) 147/96  Pulse: 81  Resp: 18  Temp: 98.1 F (36.7 C)  TempSrc: Tympanic  SpO2: 99%  Weight: 250 lb 11.2 oz (113.7 kg)  Height: 5\' 10"  (1.778 m)   Physical Exam Cardiovascular:     Rate and Rhythm: Normal rate and regular rhythm.     Heart sounds: Normal heart sounds.  Pulmonary:     Effort: Pulmonary effort is normal.     Breath sounds: Normal breath sounds.  Abdominal:     General: Bowel sounds are normal.     Palpations: Abdomen is soft.  Skin:    General: Skin is warm and dry.  Neurological:     Mental Status: He is alert and oriented to person, place, and time.           Latest Ref Rng & Units 02/22/2017    1:32 PM  CMP  Glucose 65 - 99 mg/dL 98   BUN 6 - 20 mg/dL 14   Creatinine 1.61 - 1.24 mg/dL 0.96   Sodium 045 -  409 mmol/L 133   Potassium 3.5 - 5.1 mmol/L 3.7   Chloride 101 - 111 mmol/L 105   CO2 22 - 32 mmol/L 21   Calcium 8.9 - 10.3 mg/dL 9.3       Latest Ref Rng & Units 02/22/2017    1:32 PM  CBC  WBC 3.8 - 10.6 K/uL 9.9   Hemoglobin 13.0 - 18.0 g/dL 81.1   Hematocrit 91.4 - 52.0 % 47.5   Platelets 150 - 440 K/uL 180     No images are attached to the encounter.  US Abdomen Limited  RUQ (LIVER/GB)  Result Date: 06/03/2023 CLINICAL DATA:  elevated lfts EXAM: ULTRASOUND ABDOMEN LIMITED RIGHT UPPER QUADRANT COMPARISON:  April 28, 2023. FINDINGS: Gallbladder: No gallstones or wall thickening visualized. No sonographic Murphy sign noted by sonographer. Common bile duct: Diameter: Visualized portion measures 3 mm, within normal limits. Liver: No focal lesion identified. Diffusely increased in parenchymal echogenicity. Portal vein is patent on color Doppler imaging with normal direction of blood flow towards the liver. Other: None. IMPRESSION: Hepatic steatosis. Electronically Signed   By: Meda Klinefelter M.D.   On: 06/03/2023 09:47    Assessment and plan- Patient is a 47 y.o. 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 Ultrasound abdomen showed fatty liver but no evidence of cirrhosis.  Patient is adopted and has no idea of his family history.  I did recommend that his children should get tested for hereditary hemochromatosis  With regards to management of his hereditary hemochromatosis he would benefit from weekly phlebotomy with a goal to keep ferritin less than 100.  He should avoid all sorts of iron supplements and also needs to abstain from alcohol.  Avoid uncooked seafood such as oysters which can lead to fulminant Vibrio vulnificus  infections.  I suspect patient will need at least 20-30 sessions of phlebotomy before her ferritin will come down to less than 100.  H&H and weekly phlebotomies for the next 3 months.  Ferritin levels to be checked every 3 weeks.  CBC ferritin TSH CMP and testosterone levels in 3 months and I will see him thereafter   Thank you for this kind referral and the opportunity to participate in the care of this patient   Visit Diagnosis 1. Hereditary hemochromatosis (HCC)     Dr. Owens Shark, MD, MPH [facility] [phone] 07/02/2023

## 2023-07-02 NOTE — Patient Instructions (Signed)

## 2023-07-07 ENCOUNTER — Inpatient Hospital Stay: Payer: 59

## 2023-07-07 ENCOUNTER — Other Ambulatory Visit: Payer: Self-pay | Admitting: *Deleted

## 2023-07-07 ENCOUNTER — Ambulatory Visit: Payer: 59

## 2023-07-07 LAB — HEMOGLOBIN AND HEMATOCRIT (CANCER CENTER ONLY)
HCT: 42.5 % (ref 39.0–52.0)
Hemoglobin: 15.3 g/dL (ref 13.0–17.0)

## 2023-07-07 LAB — FERRITIN: Ferritin: 3069 ng/mL — ABNORMAL HIGH (ref 24–336)

## 2023-07-12 ENCOUNTER — Inpatient Hospital Stay: Payer: 59

## 2023-07-16 ENCOUNTER — Inpatient Hospital Stay: Payer: 59

## 2023-07-16 LAB — FERRITIN: Ferritin: 3234 ng/mL — ABNORMAL HIGH (ref 24–336)

## 2023-07-16 LAB — HEMOGLOBIN AND HEMATOCRIT (CANCER CENTER ONLY)
HCT: 41.1 % (ref 39.0–52.0)
Hemoglobin: 14.7 g/dL (ref 13.0–17.0)

## 2023-07-22 ENCOUNTER — Other Ambulatory Visit: Payer: 59

## 2023-07-22 ENCOUNTER — Inpatient Hospital Stay: Payer: 59

## 2023-07-22 LAB — HEMOGLOBIN AND HEMATOCRIT (CANCER CENTER ONLY)
HCT: 41.7 % (ref 39.0–52.0)
Hemoglobin: 14.8 g/dL (ref 13.0–17.0)

## 2023-07-22 LAB — FERRITIN: Ferritin: 4080 ng/mL — ABNORMAL HIGH (ref 24–336)

## 2023-07-27 ENCOUNTER — Inpatient Hospital Stay: Payer: 59 | Attending: Oncology

## 2023-07-27 ENCOUNTER — Inpatient Hospital Stay: Payer: 59

## 2023-07-27 LAB — FERRITIN: Ferritin: 4489 ng/mL — ABNORMAL HIGH (ref 24–336)

## 2023-07-27 LAB — HEMOGLOBIN AND HEMATOCRIT (CANCER CENTER ONLY)
HCT: 39.1 % (ref 39.0–52.0)
Hemoglobin: 14 g/dL (ref 13.0–17.0)

## 2023-08-04 ENCOUNTER — Inpatient Hospital Stay: Payer: 59

## 2023-08-04 LAB — HEMOGLOBIN AND HEMATOCRIT (CANCER CENTER ONLY)
HCT: 40.8 % (ref 39.0–52.0)
Hemoglobin: 14.6 g/dL (ref 13.0–17.0)

## 2023-08-05 ENCOUNTER — Telehealth: Payer: Self-pay | Admitting: Oncology

## 2023-08-05 NOTE — Telephone Encounter (Signed)
Per Secure chat,   Dr Smith Robert said: "schedule him for twice weekly phleb starting 1st week of December .H/H once a week. ferritin Q2 weeks. I will see him in 3 months with CBC with differential CMP ferritin and iron studies for possible phlebotomy "

## 2023-08-09 ENCOUNTER — Inpatient Hospital Stay: Payer: 59

## 2023-08-09 LAB — FERRITIN: Ferritin: 3513 ng/mL — ABNORMAL HIGH (ref 24–336)

## 2023-08-09 LAB — HEMOGLOBIN AND HEMATOCRIT (CANCER CENTER ONLY)
HCT: 41.4 % (ref 39.0–52.0)
Hemoglobin: 14.6 g/dL (ref 13.0–17.0)

## 2023-08-09 NOTE — Patient Instructions (Signed)

## 2023-08-17 ENCOUNTER — Inpatient Hospital Stay: Payer: 59

## 2023-08-17 LAB — HEMOGLOBIN AND HEMATOCRIT (CANCER CENTER ONLY)
HCT: 43.1 % (ref 39.0–52.0)
Hemoglobin: 15.1 g/dL (ref 13.0–17.0)

## 2023-08-23 ENCOUNTER — Inpatient Hospital Stay: Payer: 59

## 2023-08-24 ENCOUNTER — Other Ambulatory Visit: Payer: 59 | Attending: Oncology

## 2023-08-24 ENCOUNTER — Inpatient Hospital Stay: Payer: 59

## 2023-08-24 LAB — HEMOGLOBIN AND HEMATOCRIT (CANCER CENTER ONLY)
HCT: 44 % (ref 39.0–52.0)
Hemoglobin: 15.7 g/dL (ref 13.0–17.0)

## 2023-08-24 LAB — FERRITIN: Ferritin: 3609 ng/mL — ABNORMAL HIGH (ref 24–336)

## 2023-08-24 NOTE — Patient Instructions (Signed)

## 2023-08-24 NOTE — Progress Notes (Signed)
Patient tolatered phlebotomy today, performed in LAC using 20g angiocath. Difficult removal blood very thick slow running, multiple sticks, 2 RNS 250 cc removed as ordered. Last ferritin 3513. Order to hold if < 100. No concerns voiced. Snack and po fluids offered. Stable at discharge. Refused AVS .

## 2023-08-26 ENCOUNTER — Inpatient Hospital Stay: Payer: 59 | Attending: Oncology

## 2023-08-30 ENCOUNTER — Inpatient Hospital Stay: Payer: 59

## 2023-09-01 ENCOUNTER — Ambulatory Visit: Payer: 59

## 2023-09-01 ENCOUNTER — Ambulatory Visit
Admission: EM | Admit: 2023-09-01 | Discharge: 2023-09-01 | Disposition: A | Discharge status: Home / Self Care | Payer: 59 | Attending: Internal Medicine | Admitting: Internal Medicine

## 2023-09-01 DIAGNOSIS — J189 Pneumonia, unspecified organism: Secondary | ICD-10-CM | POA: Diagnosis not present

## 2023-09-01 DIAGNOSIS — R051 Acute cough: Secondary | ICD-10-CM

## 2023-09-01 MED ORDER — DOXYCYCLINE HYCLATE 100 MG PO CAPS: 100.0000 mg | ORAL_CAPSULE | Freq: Two times a day (BID) | ORAL | 0 refills | Status: AC

## 2023-09-01 MED ORDER — AZITHROMYCIN 250 MG PO TABS: 250.0000 mg | ORAL_TABLET | Freq: Every day | ORAL | 0 refills | Status: DC

## 2023-09-01 MED ORDER — BENZONATATE 200 MG PO CAPS: 200.0000 mg | ORAL_CAPSULE | Freq: Three times a day (TID) | ORAL | 0 refills | Status: DC | PRN

## 2023-09-01 NOTE — ED Triage Notes (Signed)
Pt c/o cough x 1 wk

## 2023-09-01 NOTE — Discharge Instructions (Signed)
Start doxycycline and zithromax as prescribed. Tessalon as needed for cough.  Lots of rest and fluids.  Please follow-up with your PCP in 2 to 3 days for recheck.  Also follow-up PCP in 4 weeks for repeat chest x-ray to confirm resolution of the infection.  Please go to the ER for any worsening symptoms.  Hope you feel better soon!

## 2023-09-01 NOTE — ED Provider Notes (Addendum)
[facility]    CSN: [id number] Arrival date & time: 09/01/23  1415      History   Chief Complaint Chief Complaint  Patient presents with   Cough    HPI Kenneth Gibson is a 47 y.o. male  presents for evaluation of URI symptoms for 5-7 days. Patient reports associated symptoms of productive cough.  States that fevers initially but those have since resolved.  Denies N/V/D, sore throat, ear pain, body aches, shortness of breath. Patient does not have a hx of asthma. Patient does not have a history of smoking.  Reports reports sick contacts via the parade over the weekend.  Pt has taken cough syrup OTC for symptoms. Pt has no other concerns at this time.    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 Authorizing Provider  alfuzosin (UROXATRAL) 10 MG 24 hr tablet    Yes [provider]  ALPRAZolam (XANAX) 0.5 MG tablet Take 1 tablet 30 min prior to your scheduled surgery appointment.  Bring the remaining tablets with you.  Have a designated driver. 10/27/17  Yes [provider]  azithromycin (ZITHROMAX) 250 MG tablet Take 1 tablet (250 mg total) by mouth daily. Take first 2 tablets together, then 1 every day until finished. 09/01/23  Yes Radford Pax, NP  benzonatate (TESSALON) 200 MG capsule Take 1 capsule (200 mg total) by mouth 3 (three) times daily as needed. 09/01/23  Yes  Radford Pax, NP  doxycycline (VIBRAMYCIN) 100 MG capsule Take 1 capsule (100 mg total) by mouth 2 (two) times daily for 10 days. 09/01/23 09/11/23 Yes Radford Pax, NP  famotidine (PEPCID) 40 MG tablet Take 1 tablet by mouth at bedtime. 06/22/23 06/21/24 Yes [provider]  gabapentin (NEURONTIN) 400 MG capsule Take 1 capsule (400 mg total) by mouth 3 (three) times daily. 10/17/20  Yes Deeann Saint, MD  HYDROcodone-acetaminophen [facility]) 5-325 MG tablet Take 1-2 tablets by mouth every 6 (six) hours as needed. 10/17/20  Yes Deeann Saint, MD  lisdexamfetamine (VYVANSE) 50 MG capsule Take by mouth. 09/15/23 10/15/23 Yes [provider]  meloxicam (MOBIC) 15 MG tablet Take 1 tablet (15 mg total) by mouth daily. 10/17/20  Yes Deeann Saint, MD    Family History History reviewed. No pertinent family history.  Social History Social History   Tobacco Use   Smoking status: Never   Smokeless tobacco: Current    Types: Snuff  Substance Use Topics   Alcohol use: Yes    Alcohol/week: 6.0 standard drinks of alcohol    Types: 6 Cans of beer per week    Comment: social   Drug use: No     Allergies   Penicillins   Review of Systems Review of Systems  Respiratory:  Positive for cough.      Physical Exam  Triage Vital Signs ED Triage Vitals  Encounter Vitals Group     BP 09/01/23 1426 132/86     Systolic BP Percentile --      Diastolic BP Percentile --      Pulse Rate 09/01/23 1426 97     Resp 09/01/23 1426 16     Temp 09/01/23 1426 98.9 F (37.2 C)     Temp Source 09/01/23 1426 Oral     SpO2 09/01/23 1426 95 %     Weight 09/01/23 1425 250 lb (113.4 kg)     Height 09/01/23 1425 5\' 10"  (1.778 m)     Head Circumference --      Peak Flow --      Pain Score 09/01/23 1428 0     Pain Loc --      Pain Education --      Exclude from Growth Chart --    No data found.  Updated Vital Signs BP 132/86 (BP Location: Left Arm)   Pulse 97   Temp 98.9 F (37.2 C)  (Oral)   Resp 16   Ht 5\' 10"  (1.778 m)   Wt 250 lb (113.4 kg)   SpO2 95%   BMI 35.87 kg/m   Visual Acuity Right Eye Distance:   Left Eye Distance:   Bilateral Distance:    Right Eye Near:   Left Eye Near:    Bilateral Near:     Physical Exam Vitals and nursing note reviewed.  Constitutional:      General: He is not in acute distress.    Appearance: Normal appearance. He is not ill-appearing or toxic-appearing.  HENT:     Head: Normocephalic and atraumatic.     Right Ear: Tympanic membrane and ear canal normal.     Left Ear: Tympanic membrane and ear canal normal.     Nose: Congestion present.     Mouth/Throat:     Mouth: Mucous membranes are moist.     Pharynx: No oropharyngeal exudate or posterior oropharyngeal erythema.  Eyes:     Pupils: Pupils are equal, round, and reactive to light.  Cardiovascular:     Rate and Rhythm: Normal rate and regular rhythm.     Heart sounds: Normal heart sounds.  Pulmonary:     Effort: Pulmonary effort is normal.     Breath sounds: Normal breath sounds.  Musculoskeletal:     Cervical back: Normal range of motion and neck supple.  Lymphadenopathy:     Cervical: No cervical adenopathy.  Skin:    General: Skin is warm and dry.  Neurological:     General: No focal deficit present.     Mental Status: He is alert and oriented to person, place, and time.  Psychiatric:        Mood and Affect: Mood normal.        Behavior: Behavior normal.      UC Treatments / Results  Labs (all labs ordered are listed, but only abnormal results are displayed) Labs Reviewed - No data to display  EKG   Radiology DG Chest 2 View  Result Date: 09/01/2023 CLINICAL DATA:  Cough EXAM: CHEST - 2 VIEW COMPARISON:  None Available. FINDINGS: No pneumothorax or effusion. No edema. Normal cardiopericardial silhouette. Degenerative changes of the spine. Subtle opacity right upper lung. An infiltrates possible. Recommend follow up to confirm clearance.  IMPRESSION: Subtle hazy opacity right upper lobe. Focal possible infiltrate. Recommend follow-up Electronically Signed   By: Karen Kays M.D.   On:  09/01/2023 15:42    Procedures Procedures (including critical care time)  Medications Ordered in UC Medications - No data to display  Initial Impression / Assessment and Plan / UC Course  I have reviewed the triage vital signs and the nursing notes.  Pertinent labs & imaging results that were available during my care of the patient were reviewed by me and considered in my medical decision making (see chart for details).     Reviewed exam and symptoms with patient.  Wet read of x-ray concerning for right sided pneumonia.  Will treat with Zithromax and doxycycline.  Tessalon as needed for cough.  PCP follow-up 2 to 3 days for recheck as well as 4 weeks for repeat chest x-ray.  ER precautions reviewed.  Addendum 1611: Radiology overread of x-ray does show right upper lobe opacity.  No change in treatment. Final Clinical Impressions(s) / UC Diagnoses   Final diagnoses:  Acute cough  Pneumonia of right lung due to infectious organism, unspecified part of lung     Discharge Instructions      Start doxycycline and zithromax as prescribed. Tessalon as needed for cough.  Lots of rest and fluids.  Please follow-up with your PCP in 2 to 3 days for recheck.  Also follow-up PCP in 4 weeks for repeat chest x-ray to confirm resolution of the infection.  Please go to the ER for any worsening symptoms.  Hope you feel better soon!     ED Prescriptions     Medication Sig Dispense Auth. Provider   azithromycin (ZITHROMAX) 250 MG tablet Take 1 tablet (250 mg total) by mouth daily. Take first 2 tablets together, then 1 every day until finished. 6 tablet Radford Pax, NP   benzonatate (TESSALON) 200 MG capsule Take 1 capsule (200 mg total) by mouth 3 (three) times daily as needed. 20 capsule Radford Pax, NP   doxycycline (VIBRAMYCIN) 100 MG capsule  Take 1 capsule (100 mg total) by mouth 2 (two) times daily for 10 days. 20 capsule Radford Pax, NP      PDMP not reviewed this encounter.   Radford Pax, NP 09/01/23 1516    Radford Pax, NP 09/01/23 [phone]

## 2023-09-03 ENCOUNTER — Inpatient Hospital Stay: Payer: 59

## 2023-09-03 ENCOUNTER — Other Ambulatory Visit: Payer: Self-pay | Admitting: *Deleted

## 2023-09-03 LAB — HEMOGLOBIN AND HEMATOCRIT (CANCER CENTER ONLY)
HCT: 38 % — ABNORMAL LOW (ref 39.0–52.0)
Hemoglobin: 13.9 g/dL (ref 13.0–17.0)

## 2023-09-03 NOTE — Patient Instructions (Signed)

## 2023-09-03 NOTE — Progress Notes (Signed)
Patient tolatered phlebotomy well today, performed in L hand using 20g angiocath. 250 cc removed as ordered( if ferritin >100). Ferritin 3609. No concerns voiced. Snack and po fluids offered. Stable at discharge. AVS given.

## 2023-09-07 ENCOUNTER — Inpatient Hospital Stay: Payer: 59

## 2023-09-07 LAB — HEMOGLOBIN AND HEMATOCRIT (CANCER CENTER ONLY)
HCT: 39.9 % (ref 39.0–52.0)
Hemoglobin: 14.3 g/dL (ref 13.0–17.0)

## 2023-09-07 LAB — FERRITIN: Ferritin: 2745 ng/mL — ABNORMAL HIGH (ref 24–336)

## 2023-09-09 ENCOUNTER — Other Ambulatory Visit: Payer: Self-pay | Admitting: *Deleted

## 2023-09-10 ENCOUNTER — Inpatient Hospital Stay: Payer: 59

## 2023-09-10 ENCOUNTER — Other Ambulatory Visit: Payer: Self-pay | Admitting: *Deleted

## 2023-09-13 ENCOUNTER — Inpatient Hospital Stay: Payer: 59

## 2023-09-13 LAB — HEMOGLOBIN AND HEMATOCRIT (CANCER CENTER ONLY)
HCT: 38.7 % — ABNORMAL LOW (ref 39.0–52.0)
Hemoglobin: 13.6 g/dL (ref 13.0–17.0)

## 2023-09-16 ENCOUNTER — Inpatient Hospital Stay: Payer: 59

## 2023-09-16 NOTE — Progress Notes (Signed)
Removed 250 ml of blood; Tolerated well. VSS at time of discharge.

## 2023-09-20 ENCOUNTER — Inpatient Hospital Stay: Payer: 59

## 2023-09-20 LAB — HEMOGLOBIN AND HEMATOCRIT (CANCER CENTER ONLY)
HCT: 36.7 % — ABNORMAL LOW (ref 39.0–52.0)
Hemoglobin: 12.9 g/dL — ABNORMAL LOW (ref 13.0–17.0)

## 2023-09-21 ENCOUNTER — Encounter: Payer: Self-pay | Admitting: Urology

## 2023-09-21 ENCOUNTER — Other Ambulatory Visit: Payer: Self-pay | Admitting: *Deleted

## 2023-09-21 ENCOUNTER — Other Ambulatory Visit
Admission: RE | Admit: 2023-09-21 | Discharge: 2023-09-21 | Disposition: A | Discharge status: Home / Self Care | Payer: 59 | Attending: Urology | Admitting: Urology

## 2023-09-21 ENCOUNTER — Ambulatory Visit: Payer: 59 | Admitting: Urology

## 2023-09-21 VITALS — BP 153/91 | HR 85 | Ht 70.0 in | Wt 245.0 lb

## 2023-09-21 DIAGNOSIS — Z87898 Personal history of other specified conditions: Secondary | ICD-10-CM | POA: Diagnosis not present

## 2023-09-21 DIAGNOSIS — R310 Gross hematuria: Secondary | ICD-10-CM

## 2023-09-21 DIAGNOSIS — N200 Calculus of kidney: Secondary | ICD-10-CM

## 2023-09-21 DIAGNOSIS — Z87442 Personal history of urinary calculi: Secondary | ICD-10-CM

## 2023-09-21 DIAGNOSIS — R1084 Generalized abdominal pain: Secondary | ICD-10-CM

## 2023-09-21 DIAGNOSIS — R319 Hematuria, unspecified: Secondary | ICD-10-CM | POA: Diagnosis present

## 2023-09-21 DIAGNOSIS — R82998 Other abnormal findings in urine: Secondary | ICD-10-CM | POA: Diagnosis not present

## 2023-09-21 LAB — URINALYSIS, COMPLETE (UACMP) WITH MICROSCOPIC
Bilirubin Urine: NEGATIVE
Glucose, UA: NEGATIVE mg/dL
Ketones, ur: NEGATIVE mg/dL
Leukocytes,Ua: NEGATIVE
Nitrite: NEGATIVE
Protein, ur: NEGATIVE mg/dL
Specific Gravity, Urine: >1.03 — ABNORMAL HIGH (ref 1.005–1.030)
Squamous Epithelial / HPF: NONE SEEN /[HPF] (ref 0–5)
pH: 6 (ref 5.0–8.0)

## 2023-09-21 NOTE — Progress Notes (Signed)
   09/21/23 9:52 AM   Kenneth Gibson 04-Jan-1976 [id number]  CC: Possible gross hematuria, history of kidney stones, PSA screening  HPI: 47 year old male who presents with possible gross hematuria.  He has had multiple episodes of dark-colored urine that occur intermittently over the last few months.  He is unable to identify any aggravating or alleviating factors.  He denies any frank gross hematuria.  He had a urinalysis in July 2024 with significant microscopic hematuria and pyuria, culture was negative, but he was treated with antibiotics for possible prostatitis at that time.  He denies any dysuria, urgency or frequency.  He has some intermittent low back pain.  He does have a history of kidney stones.  Urinalysis today is completely benign.  PSA May 2024 normal at 0.35.   PMH: Past Medical History:  Diagnosis Date   ADHD    Anxiety    GERD (gastroesophageal reflux disease)    History of kidney stones    Kidney stones     Surgical History: Past Surgical History:  Procedure Laterality Date   BELPHAROPTOSIS REPAIR     GANGLION CYST EXCISION Left 10/17/2020   Procedure: REMOVAL GANGLION OF WRIST;  Surgeon: Cleotilde Barrio, MD;  Location: [facility] ORS;  Service: Orthopedics;  Laterality: Left;    Family History: History reviewed. No pertinent family history.  Social History:  reports that he has never smoked. His smokeless tobacco use includes snuff. He reports current alcohol use of about 6.0 standard drinks of alcohol per week. He reports that he does not use drugs.  Physical Exam: BP (!) 153/91 (BP Location: Left Arm, Patient Position: Sitting, Cuff Size: Large)   Pulse 85   Ht 5' 10 (1.778 m)   Wt 245 lb (111.1 kg)   BMI 35.15 kg/m    Constitutional:  Alert and oriented, No acute distress. Cardiovascular: No clubbing, cyanosis, or edema. Respiratory: Normal respiratory effort, no increased work of breathing. GI: Abdomen is soft, nontender, nondistended, no  abdominal masses   Laboratory Data: Reviewed, see HPI   Assessment & Plan:   47 year old male with history of intermittent dark-colored urine of unclear etiology, unclear if this is definitively gross hematuria or related to dehydration or certain p.o. intake.  Urinalysis today is completely benign.  With his history of kidney stones he was interested in pursuing a CT scan which I think is very reasonable.  I do not feel he warrants cystoscopy at this time with his multiple benign urinalysis over the last 6 months in absence of definitive gross hematuria, as well as with his young age.  Reassurance was provided regarding his normal PSA value.  CT stone protocol, call with results   Redell Burnet, MD 09/21/2023  [facility] [address], [address] [place], [place] [address] [phone]

## 2023-09-24 ENCOUNTER — Inpatient Hospital Stay: Payer: 59 | Attending: Oncology

## 2023-09-24 ENCOUNTER — Other Ambulatory Visit: Payer: Self-pay | Admitting: Nurse Practitioner

## 2023-09-24 ENCOUNTER — Telehealth: Payer: Self-pay | Admitting: *Deleted

## 2023-09-24 NOTE — Telephone Encounter (Signed)
 Left vm for patient to let him know provider changed phlebotomy orders for 500 ml/weekly drawn off +/- iv fluids if he doesn't tolerate the additional amt at one time. Mychart msg sent to pt.

## 2023-09-24 NOTE — Progress Notes (Signed)
 Patient is unable to ocntinue twice weekly phlebotomy d/t work schedule. Requesting larger volume phlebotomy once weekly. We can try this as he has been tolerating well. Orders updated and prn order for fluid add back added.

## 2023-09-24 NOTE — Progress Notes (Signed)
 Patient inquired about future phlebotomy apts. He would like to have 500 ml drawn off weekly at one given time rather than come twice a week with 250 ml drawn off each visit. Per Tinnie, NP -APP can order this for next week. Replacement fluid can be given if there is intolerance. Pt stated that he would prefer the 500 ml to be drawn off to avoid frequent visit in clinic, getting off early from job site, commuting long distance for apts. In addition, he wants to avoid multiple lab draw/ IV sticks in a weeks time.

## 2023-09-28 ENCOUNTER — Inpatient Hospital Stay: Payer: 59

## 2023-09-28 ENCOUNTER — Ambulatory Visit: Payer: 59

## 2023-09-28 LAB — HEMOGLOBIN AND HEMATOCRIT (CANCER CENTER ONLY)
HCT: 38.6 % — ABNORMAL LOW (ref 39.0–52.0)
Hemoglobin: 13.7 g/dL (ref 13.0–17.0)

## 2023-09-28 LAB — FERRITIN: Ferritin: 2747 ng/mL — ABNORMAL HIGH (ref 24–336)

## 2023-09-29 ENCOUNTER — Other Ambulatory Visit: Payer: Self-pay | Admitting: *Deleted

## 2023-10-01 ENCOUNTER — Inpatient Hospital Stay: Payer: 59

## 2023-10-05 ENCOUNTER — Inpatient Hospital Stay: Payer: 59

## 2023-10-05 ENCOUNTER — Ambulatory Visit
Admission: RE | Admit: 2023-10-05 | Discharge: 2023-10-05 | Disposition: A | Discharge status: Home / Self Care | Payer: 59 | Source: Ambulatory Visit | Attending: Urology | Admitting: Urology

## 2023-10-05 DIAGNOSIS — R1084 Generalized abdominal pain: Secondary | ICD-10-CM | POA: Diagnosis present

## 2023-10-05 LAB — CMP (CANCER CENTER ONLY)
ALT: 129 U/L — ABNORMAL HIGH (ref 0–44)
AST: 59 U/L — ABNORMAL HIGH (ref 15–41)
Albumin: 4.2 g/dL (ref 3.5–5.0)
Alkaline Phosphatase: 61 U/L (ref 38–126)
Anion gap: 7 (ref 5–15)
BUN: 15 mg/dL (ref 6–20)
CO2: 22 mmol/L (ref 22–32)
Calcium: 9.2 mg/dL (ref 8.9–10.3)
Chloride: 108 mmol/L (ref 98–111)
Creatinine: 0.69 mg/dL (ref 0.61–1.24)
GFR, Estimated: >60 mL/min (ref >60)
Glucose, Bld: 113 mg/dL — ABNORMAL HIGH (ref 70–99)
Potassium: 3.8 mmol/L (ref 3.5–5.1)
Sodium: 137 mmol/L (ref 135–145)
Total Bilirubin: 0.6 mg/dL (ref 0.0–1.2)
Total Protein: 7.2 g/dL (ref 6.5–8.1)

## 2023-10-05 LAB — CBC WITH DIFFERENTIAL (CANCER CENTER ONLY)
Abs Immature Granulocytes: 0.01 10*3/uL (ref 0.00–0.07)
Basophils Absolute: 0 10*3/uL (ref 0.0–0.1)
Basophils Relative: 1 %
Eosinophils Absolute: 0.2 10*3/uL (ref 0.0–0.5)
Eosinophils Relative: 3 %
HCT: 39.8 % (ref 39.0–52.0)
Hemoglobin: 14.1 g/dL (ref 13.0–17.0)
Immature Granulocytes: 0 %
Lymphocytes Relative: 27 %
Lymphs Abs: 1.5 10*3/uL (ref 0.7–4.0)
MCH: 36.5 pg — ABNORMAL HIGH (ref 26.0–34.0)
MCHC: 35.4 g/dL (ref 30.0–36.0)
MCV: 103.1 fL — ABNORMAL HIGH (ref 80.0–100.0)
Monocytes Absolute: 0.4 10*3/uL (ref 0.1–1.0)
Monocytes Relative: 8 %
Neutro Abs: 3.4 10*3/uL (ref 1.7–7.7)
Neutrophils Relative %: 61 %
Platelet Count: 128 10*3/uL — ABNORMAL LOW (ref 150–400)
RBC: 3.86 MIL/uL — ABNORMAL LOW (ref 4.22–5.81)
RDW: 13 % (ref 11.5–15.5)
Smear Review: NORMAL
WBC Count: 5.5 10*3/uL (ref 4.0–10.5)
nRBC: 0 % (ref 0.0–0.2)

## 2023-10-05 LAB — TSH: TSH: 0.841 u[IU]/mL (ref 0.350–4.500)

## 2023-10-06 LAB — TESTOSTERONE: Testosterone: 292 ng/dL (ref 264–916)

## 2023-10-13 ENCOUNTER — Other Ambulatory Visit: Payer: 59

## 2023-10-13 ENCOUNTER — Inpatient Hospital Stay: Payer: 59

## 2023-10-13 LAB — HEMOGLOBIN AND HEMATOCRIT (CANCER CENTER ONLY)
HCT: 41.6 % (ref 39.0–52.0)
Hemoglobin: 14.9 g/dL (ref 13.0–17.0)

## 2023-10-13 NOTE — Progress Notes (Signed)
Kenneth Gibson presents today for phlebotomy per MD orders. Phlebotomy procedure started at 245p and ended at 300p. 500 ml  removed. Patient observed for 15 minutes after procedure per patient request , denies any adverse signs or symptoms.  Patient tolerated procedure well. IV needle removed intact.

## 2023-10-19 ENCOUNTER — Other Ambulatory Visit: Payer: Self-pay | Admitting: *Deleted

## 2023-10-20 ENCOUNTER — Inpatient Hospital Stay: Payer: 59

## 2023-10-20 LAB — FERRITIN: Ferritin: 1983 ng/mL — ABNORMAL HIGH (ref 24–336)

## 2023-10-20 LAB — HEMOGLOBIN AND HEMATOCRIT (CANCER CENTER ONLY)
HCT: 38.8 % — ABNORMAL LOW (ref 39.0–52.0)
Hemoglobin: 13.8 g/dL (ref 13.0–17.0)

## 2023-10-20 NOTE — Progress Notes (Signed)
Patient here for therapeutic phlebotomy. Last ferritin level drawn was 2,747. Patient required 500 ml drawn off based off perimeters. Phlebotomy performed. Patient tolerated procedure well.

## 2023-10-21 ENCOUNTER — Other Ambulatory Visit: Payer: Self-pay | Admitting: Emergency Medicine

## 2023-10-21 DIAGNOSIS — R0781 Pleurodynia: Secondary | ICD-10-CM

## 2023-10-21 DIAGNOSIS — R053 Chronic cough: Secondary | ICD-10-CM

## 2023-10-29 ENCOUNTER — Inpatient Hospital Stay: Payer: 59

## 2023-10-29 ENCOUNTER — Ambulatory Visit
Admission: RE | Admit: 2023-10-29 | Discharge: 2023-10-29 | Disposition: A | Discharge status: Home / Self Care | Payer: 59 | Source: Ambulatory Visit | Attending: Emergency Medicine

## 2023-10-29 ENCOUNTER — Inpatient Hospital Stay: Payer: 59 | Attending: Oncology

## 2023-10-29 DIAGNOSIS — R053 Chronic cough: Secondary | ICD-10-CM

## 2023-10-29 DIAGNOSIS — R0781 Pleurodynia: Secondary | ICD-10-CM

## 2023-10-29 LAB — FERRITIN: Ferritin: 2266 ng/mL — ABNORMAL HIGH (ref 24–336)

## 2023-10-29 LAB — HEMOGLOBIN AND HEMATOCRIT (CANCER CENTER ONLY)
HCT: 42 % (ref 39.0–52.0)
Hemoglobin: 15.2 g/dL (ref 13.0–17.0)

## 2023-11-03 ENCOUNTER — Other Ambulatory Visit: Payer: 59

## 2023-11-03 ENCOUNTER — Ambulatory Visit: Payer: 59 | Admitting: Oncology

## 2023-11-05 ENCOUNTER — Inpatient Hospital Stay: Payer: 59 | Admitting: Oncology

## 2023-11-05 ENCOUNTER — Inpatient Hospital Stay: Payer: 59

## 2023-11-10 ENCOUNTER — Inpatient Hospital Stay: Payer: 59

## 2023-11-10 ENCOUNTER — Inpatient Hospital Stay: Payer: 59 | Admitting: Oncology

## 2023-11-17 ENCOUNTER — Telehealth: Payer: Self-pay

## 2023-11-17 ENCOUNTER — Encounter: Payer: Self-pay | Admitting: Oncology

## 2023-11-17 ENCOUNTER — Inpatient Hospital Stay (HOSPITAL_BASED_OUTPATIENT_CLINIC_OR_DEPARTMENT_OTHER): Payer: 59 | Admitting: Oncology

## 2023-11-17 ENCOUNTER — Inpatient Hospital Stay: Payer: 59

## 2023-11-17 LAB — URINALYSIS, COMPLETE (UACMP) WITH MICROSCOPIC
Bacteria, UA: NONE SEEN
Bilirubin Urine: NEGATIVE
Glucose, UA: NEGATIVE mg/dL
Ketones, ur: NEGATIVE mg/dL
Leukocytes,Ua: NEGATIVE
Nitrite: NEGATIVE
Protein, ur: NEGATIVE mg/dL
Specific Gravity, Urine: 1.015 (ref 1.005–1.030)
Squamous Epithelial / HPF: 0 /HPF (ref 0–5)
pH: 7 (ref 5.0–8.0)

## 2023-11-17 LAB — HEMOGLOBIN AND HEMATOCRIT (CANCER CENTER ONLY)
HCT: 42.6 % (ref 39.0–52.0)
Hemoglobin: 15.3 g/dL (ref 13.0–17.0)

## 2023-11-17 LAB — FERRITIN: Ferritin: 2139 ng/mL — ABNORMAL HIGH (ref 24–336)

## 2023-11-17 NOTE — Progress Notes (Unsigned)
 Patient here for oncology follow-up appointment, expresses concerns of  hematuria, burning with urination

## 2023-11-17 NOTE — Telephone Encounter (Signed)
-----   Message from Sondra Come sent at 11/17/2023  3:11 PM EST ----- Regarding: Cystoscopy Please offer him cystoscopy with me for further evaluation of his persistent microscopic and gross hematuria  Legrand Rams, MD 11/17/2023

## 2023-11-18 LAB — URINE CULTURE: Culture: <10000 — AB

## 2023-11-18 NOTE — Telephone Encounter (Signed)
 Called pt. appt scheduled.

## 2023-11-19 ENCOUNTER — Encounter: Payer: Self-pay | Admitting: Oncology

## 2023-11-19 NOTE — Progress Notes (Signed)
 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  His past medical history is significant for ADHD and anxiety.  He has symptoms of epigastric pain and chest wall pain.  He underwent a complete cardiology evaluation including a nuclear stress test which was unremarkable.  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  CMP showed elevated AST and ALT of 92 and 177 respectively with a total normal bilirubin.  Iron studies showed ferritin levels greater than 1500 with an iron saturation of 99%.  Hepatitis B and C testing were negative.  CBC in the past has been normal.  Patient drinks about 1 beer per day   Interval history-patient is cutting back on his alcohol intake but has not stopped altogether.  He was also down with viral infection over the last couple of weeks.  He currently reports bleeding in his urine over the last couple of days  ECOG PS- 0 Pain scale- 0   Review of systems- Review of Systems  Constitutional:  Negative for chills, fever, malaise/fatigue and weight loss.  HENT:  Negative for congestion, ear discharge and nosebleeds.   Eyes:  Negative for blurred vision.  Respiratory:  Negative for cough, hemoptysis, sputum production, shortness of breath and wheezing.   Cardiovascular:  Negative for chest pain, palpitations, orthopnea and claudication.  Gastrointestinal:   Negative for abdominal pain, blood in stool, constipation, diarrhea, heartburn, melena, nausea and vomiting.  Genitourinary:  Negative for dysuria, flank pain, frequency, hematuria and urgency.  Musculoskeletal:  Negative for back pain, joint pain and myalgias.  Skin:  Negative for rash.  Neurological:  Negative for dizziness, tingling, focal weakness, seizures, weakness and headaches.  Endo/Heme/Allergies:  Does not bruise/bleed easily.  Psychiatric/Behavioral:  Negative for depression and suicidal ideas. The patient does not have insomnia.       Allergies  Allergen Reactions   Penicillins Other (See Comments) and Rash    Unknown reaction. Had as a child  unknown  unknown  Unknown reaction. Had as a child  unknown     Past Medical History:  Diagnosis Date   ADHD    Anxiety    GERD (gastroesophageal reflux disease)    History of kidney stones    Kidney stones      Past Surgical History:  Procedure Laterality Date   BELPHAROPTOSIS REPAIR     GANGLION CYST EXCISION Left 10/17/2020   Procedure: REMOVAL GANGLION OF WRIST;  Surgeon: Deeann Saint, MD;  Location: [facility] ORS;  Service: Orthopedics;  Laterality: Left;    Social History   Socioeconomic History   Marital status: Married    Spouse name: Not on file   Number of children: 2   Years of education: Not on file   Highest education level: Not on file  Occupational History   Not on file  Tobacco Use   Smoking status: Never   Smokeless tobacco: Current    Types: Snuff  Substance and Sexual Activity  Alcohol use: Yes    Alcohol/week: 6.0 standard drinks of alcohol    Types: 6 Cans of beer per week    Comment: social   Drug use: No   Sexual activity: Yes  Other Topics Concern   Not on file  Social History Narrative   Not on file   Social Drivers of Health   Financial Resource Strain: Low Risk  (08/16/2023)   Received from [facility]   Overall Financial Resource Strain (CARDIA)     Difficulty of Paying Living Expenses: Not hard at all  Food Insecurity: No Food Insecurity (08/16/2023)   Received from [facility]   Hunger Vital Sign    Worried About Running Out of Food in the Last Year: Never true    Ran Out of Food in the Last Year: Never true  Transportation Needs: No Transportation Needs (08/16/2023)   Received from [facility]    In the past 12 months, has lack of transportation kept you from medical appointments or from getting medications?: No    Lack of Transportation (Non-Medical): No  Physical Activity: Not on file  Stress: Not on file  Social Connections: Not on file  Intimate Partner Violence: Not At Risk (06/29/2023)   Humiliation, Afraid, Rape, and Kick questionnaire    Fear of Current or Ex-Partner: No    Emotionally Abused: No    Physically Abused: No    Sexually Abused: No    History reviewed. No pertinent family history.   Current Outpatient Medications:    famotidine (PEPCID) 40 MG tablet, Take 1 tablet by mouth at bedtime., Disp: , Rfl:    lisdexamfetamine (VYVANSE) 50 MG capsule, Take by mouth., Disp: , Rfl:    pantoprazole (PROTONIX) 40 MG tablet, Take 40 mg by mouth daily., Disp: , Rfl:    predniSONE (DELTASONE) 10 MG tablet, Take 1 tablet by mouth daily., Disp: , Rfl:    propranolol ER (INDERAL LA) 60 MG 24 hr capsule, Take 1 tablet by mouth daily., Disp: , Rfl:   Physical exam:  Vitals:   11/17/23 1345  BP: (!) 138/94  Pulse: 74  Resp: 17  Temp: 97.8 F (36.6 C)  TempSrc: Tympanic  SpO2: 96%  Weight: 252 lb (114.3 kg)   Physical Exam Cardiovascular:     Rate and Rhythm: Normal rate and regular rhythm.     Heart sounds: Normal heart sounds.  Pulmonary:     Effort: Pulmonary effort is normal.     Breath sounds: Normal breath sounds.  Skin:    General: Skin is warm and dry.  Neurological:     Mental Status: He is alert and oriented to person, place, and time.          Latest Ref Rng & Units 10/05/2023    2:16 PM  CMP  Glucose 70 - 99 mg/dL 161   BUN 6 - 20 mg/dL 15   Creatinine 0.96 - 1.24 mg/dL 0.45   Sodium 409 - 811 mmol/L 137   Potassium 3.5 - 5.1 mmol/L 3.8   Chloride 98 - 111 mmol/L 108   CO2 22 - 32 mmol/L 22   Calcium 8.9 - 10.3 mg/dL 9.2   Total Protein 6.5 - 8.1 g/dL 7.2   Total Bilirubin 0.0 - 1.2 mg/dL 0.6   Alkaline Phos 38 - 126 U/L 61   AST 15 - 41 U/L 59   ALT 0 - 44 U/L 129  Latest Ref Rng & Units 11/17/2023    1:20 PM  CBC  Hemoglobin 13.0 - 17.0 g/dL 47.4   Hematocrit 25.9 - 52.0 % 42.6     No images are attached to the encounter.  CT CHEST WO CONTRAST Result Date: 11/11/2023 CLINICAL DATA:  Pleuritic chest pain. History of familial hemochromatosis EXAM: CT CHEST WITHOUT CONTRAST TECHNIQUE: Multidetector CT imaging of the chest was performed following the standard protocol without IV contrast. RADIATION DOSE REDUCTION: This exam was performed according to the departmental dose-optimization program which includes automated exposure control, adjustment of the mA and/or kV according to patient size and/or use of iterative reconstruction technique. COMPARISON:  April 28, 2023 FINDINGS: Cardiovascular: Heart is mildly enlarged. No pericardial effusion. Thoracic aorta is normal in course and caliber Mediastinum/Nodes: Visualized thyroid is unremarkable. No axillary or mediastinal adenopathy. There is some faintly calcified mediastinal lymph nodes. Lungs/Pleura: No pleural effusion or pneumothorax. Scattered bilateral calcified pulmonary nodules. There are several scattered tiny pulmonary nodules with representative LEFT lower lobe pulmonary nodule measuring 2 mm (series 3, image 90). Minimal atelectasis. No focal consolidation. Upper Abdomen: Splenomegaly spanning 15.1 cm. Separation of the fissures and prominence of the LEFT hepatic lobe without frank nodularity. Several high density partially calcified porta hepatic lymph  nodes, similar in comparison to prior. Musculoskeletal: Degenerative changes of the thoracic spine. No acute displaced rib fracture. IMPRESSION: 1. No acute CT findings to explain patient's symptoms. 2. Multiple pulmonary nodules. Most significant: Left solid pulmonary nodule measuring 2 mm. Per Fleischner Society Guidelines, no routine follow-up imaging is recommended. These guidelines do not apply to immunocompromised patients and patients with cancer. Follow up in patients with significant comorbidities as clinically warranted. For lung cancer screening, adhere to Lung-RADS guidelines. Reference: Radiology. 2017; 284(1):228-43. 3. Similar appearance of splenomegaly. 4. Morphologic appearance of the liver which could reflect early cirrhotic changes. Recommend correlation with LFTs with consideration of dedicated ultrasound with elastography. Electronically Signed   By: Meda Klinefelter M.D.   On: 11/11/2023 16:31     Assessment and plan- Patient is a 48 y.o. 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  Ferritin levels today are 2139 likely secondary to recent viral infection as well which can bring up the ferritin again.  Continue phlebotomy until ferritin levels are less than 100.  I will see him back in 3 to 4 months with CBC CMP ferritin and iron studies  Hematuria: Urinalysis not suggestive of urinary tract infection.  However he does have RBCs in his urine.  CT renal stone study in January 2025 showed nonobstructive right renal calculus and mild splenomegaly but no other findings.  I have reached out to Dr. Richardo Hanks from urology regarding this   Visit Diagnosis 1. Hereditary hemochromatosis (HCC)      Dr. Owens Shark, MD, MPH [facility] [phone] 11/19/2023 12:59 PM

## 2023-11-25 ENCOUNTER — Inpatient Hospital Stay: Payer: 59

## 2023-11-25 ENCOUNTER — Inpatient Hospital Stay: Payer: 59 | Attending: Oncology

## 2023-11-25 LAB — HEMOGLOBIN AND HEMATOCRIT (CANCER CENTER ONLY)
HCT: 41.2 % (ref 39.0–52.0)
Hemoglobin: 14.8 g/dL (ref 13.0–17.0)

## 2023-12-03 ENCOUNTER — Inpatient Hospital Stay

## 2023-12-03 LAB — HEMOGLOBIN AND HEMATOCRIT (CANCER CENTER ONLY)
HCT: 40.6 % (ref 39.0–52.0)
Hemoglobin: 14.4 g/dL (ref 13.0–17.0)

## 2023-12-03 LAB — FERRITIN: Ferritin: 1498 ng/mL — ABNORMAL HIGH (ref 24–336)

## 2023-12-03 NOTE — Patient Instructions (Signed)

## 2023-12-03 NOTE — Progress Notes (Signed)
 Patient tolatered phlebotomy well today, performed in Rhand using 20g angiocath. Only 350 cc removed , very slow running. 2nd stick patient requested to end. Goal ferrtin <100. No concerns voiced. Snack and po fluids offered. Stable at discharge. Refused AVS .

## 2023-12-07 ENCOUNTER — Ambulatory Visit: Payer: 59 | Admitting: Urology

## 2023-12-07 VITALS — BP 122/81 | HR 76 | Ht 70.0 in | Wt 245.0 lb

## 2023-12-07 DIAGNOSIS — R310 Gross hematuria: Secondary | ICD-10-CM

## 2023-12-07 MED ORDER — NITROFURANTOIN MONOHYD MACRO 100 MG PO CAPS
100.0000 mg | ORAL_CAPSULE | Freq: Once | ORAL | Status: AC
Start: 2023-12-07 — End: 2023-12-07
  Administered 2023-12-07: 100 mg via ORAL

## 2023-12-07 MED ORDER — LIDOCAINE HCL URETHRAL/MUCOSAL 2 % EX GEL
1.0000 | Freq: Once | CUTANEOUS | Status: AC
  Administered 2023-12-07: 1 via URETHRAL

## 2023-12-07 NOTE — Progress Notes (Signed)
 Cystoscopy Procedure Note:  Indication: Microscopic hematuria  Nitrofurantoin given for prophylaxis  After informed consent and discussion of the procedure and its risks, Kenneth Gibson was positioned and prepped in the standard fashion. Cystoscopy was performed with a flexible cystoscope. The urethra, bladder neck and entire bladder was visualized in a standard fashion. The prostate was small. The ureteral orifices were visualized in their normal location and orientation.   Imaging: CT stone protocol with 5 mm right renal stone, no hydronephrosis  Findings: Normal cystoscopy  Assessment and Plan: Follow-up with urology as needed, he opts for watchful waiting for small right renal stone  Legrand Rams, MD 12/07/2023

## 2023-12-10 ENCOUNTER — Inpatient Hospital Stay

## 2023-12-10 LAB — HEMOGLOBIN AND HEMATOCRIT (CANCER CENTER ONLY)
HCT: 40.1 % (ref 39.0–52.0)
Hemoglobin: 14.6 g/dL (ref 13.0–17.0)

## 2023-12-10 LAB — FERRITIN: Ferritin: 1328 ng/mL — ABNORMAL HIGH (ref 24–336)

## 2023-12-15 ENCOUNTER — Inpatient Hospital Stay

## 2023-12-15 LAB — HEMOGLOBIN AND HEMATOCRIT (CANCER CENTER ONLY)
HCT: 39.6 % (ref 39.0–52.0)
Hemoglobin: 14.2 g/dL (ref 13.0–17.0)

## 2023-12-15 LAB — FERRITIN: Ferritin: 1657 ng/mL — ABNORMAL HIGH (ref 24–336)

## 2023-12-15 NOTE — Progress Notes (Signed)
 Patient tolatered phlebotomy well today, performed in RAC using 20g angiocath. 500 cc removed as ordered. No concerns voiced. Snack and po fluids offered. Stable at discharge. Refused AVS .

## 2023-12-15 NOTE — Patient Instructions (Signed)

## 2023-12-23 ENCOUNTER — Other Ambulatory Visit: Payer: Self-pay

## 2023-12-24 ENCOUNTER — Inpatient Hospital Stay

## 2023-12-24 ENCOUNTER — Inpatient Hospital Stay: Attending: Oncology

## 2023-12-24 LAB — HEMOGLOBIN AND HEMATOCRIT (CANCER CENTER ONLY)
HCT: 39.6 % (ref 39.0–52.0)
Hemoglobin: 14.1 g/dL (ref 13.0–17.0)

## 2023-12-24 NOTE — Patient Instructions (Signed)

## 2023-12-24 NOTE — Progress Notes (Signed)
 Patient tolatered phlebotomy well today, performed in [facility] using 20g angiocath. 500 cc removed as ordered.  No concerns voiced. Snack and po fluids offered. Stable at discharge. Refused AVS .

## 2023-12-28 ENCOUNTER — Other Ambulatory Visit: Payer: Self-pay

## 2023-12-29 ENCOUNTER — Inpatient Hospital Stay

## 2023-12-29 LAB — HEMOGLOBIN AND HEMATOCRIT (CANCER CENTER ONLY)
HCT: 40.4 % (ref 39.0–52.0)
Hemoglobin: 14.3 g/dL (ref 13.0–17.0)

## 2024-01-03 ENCOUNTER — Inpatient Hospital Stay

## 2024-01-03 LAB — FERRITIN: Ferritin: 1064 ng/mL — ABNORMAL HIGH (ref 24–336)

## 2024-01-03 LAB — HEMOGLOBIN AND HEMATOCRIT (CANCER CENTER ONLY)
HCT: 38.7 % — ABNORMAL LOW (ref 39.0–52.0)
Hemoglobin: 13.6 g/dL (ref 13.0–17.0)

## 2024-01-03 NOTE — Patient Instructions (Signed)

## 2024-01-03 NOTE — Progress Notes (Signed)
 Kenneth Gibson presents today for phlebotomy per MD orders. Phlebotomy procedure started at 1310 and ended at 1320. 500 mls removed. Patient tolerated procedure well. IV needle removed intact.

## 2024-01-09 ENCOUNTER — Other Ambulatory Visit: Payer: Self-pay

## 2024-01-09 ENCOUNTER — Emergency Department
Admission: EM | Admit: 2024-01-09 | Discharge: 2024-01-09 | Disposition: A | Discharge status: Home / Self Care | Attending: Emergency Medicine | Admitting: Emergency Medicine

## 2024-01-09 ENCOUNTER — Emergency Department

## 2024-01-09 DIAGNOSIS — R079 Chest pain, unspecified: Secondary | ICD-10-CM | POA: Diagnosis present

## 2024-01-09 DIAGNOSIS — R1012 Left upper quadrant pain: Secondary | ICD-10-CM | POA: Diagnosis not present

## 2024-01-09 HISTORY — DX: Bronchitis, not specified as acute or chronic: J40

## 2024-01-09 HISTORY — DX: Hemochromatosis, unspecified: E83.119

## 2024-01-09 LAB — BASIC METABOLIC PANEL WITH GFR
Anion gap: 8 (ref 5–15)
BUN: 16 mg/dL (ref 6–20)
CO2: 25 mmol/L (ref 22–32)
Calcium: 8.6 mg/dL — ABNORMAL LOW (ref 8.9–10.3)
Chloride: 105 mmol/L (ref 98–111)
Creatinine, Ser: 0.89 mg/dL (ref 0.61–1.24)
GFR, Estimated: >60 mL/min (ref >60)
Glucose, Bld: 148 mg/dL — ABNORMAL HIGH (ref 70–99)
Potassium: 3.4 mmol/L — ABNORMAL LOW (ref 3.5–5.1)
Sodium: 138 mmol/L (ref 135–145)

## 2024-01-09 LAB — CBC
HCT: 39.2 % (ref 39.0–52.0)
Hemoglobin: 14.1 g/dL (ref 13.0–17.0)
MCH: 37.6 pg — ABNORMAL HIGH (ref 26.0–34.0)
MCHC: 36 g/dL (ref 30.0–36.0)
MCV: 104.5 fL — ABNORMAL HIGH (ref 80.0–100.0)
Platelets: 135 10*3/uL — ABNORMAL LOW (ref 150–400)
RBC: 3.75 MIL/uL — ABNORMAL LOW (ref 4.22–5.81)
RDW: 13.3 % (ref 11.5–15.5)
WBC: 6.5 10*3/uL (ref 4.0–10.5)
nRBC: 0 % (ref 0.0–0.2)

## 2024-01-09 LAB — HEPATIC FUNCTION PANEL
ALT: 59 U/L — ABNORMAL HIGH (ref 0–44)
AST: 35 U/L (ref 15–41)
Albumin: 3.6 g/dL (ref 3.5–5.0)
Alkaline Phosphatase: 64 U/L (ref 38–126)
Bilirubin, Direct: 0.1 mg/dL (ref 0.0–0.2)
Indirect Bilirubin: 0.7 mg/dL (ref 0.3–0.9)
Total Bilirubin: 0.8 mg/dL (ref 0.0–1.2)
Total Protein: 6.5 g/dL (ref 6.5–8.1)

## 2024-01-09 LAB — TROPONIN I (HIGH SENSITIVITY): Troponin I (High Sensitivity): 3 ng/L (ref <18)

## 2024-01-09 LAB — LIPASE, BLOOD: Lipase: 41 U/L (ref 11–51)

## 2024-01-09 MED ORDER — ALUM & MAG HYDROXIDE-SIMETH 200-200-20 MG/5ML PO SUSP
30.0000 mL | Freq: Once | ORAL | Status: AC
Start: 2024-01-09 — End: 2024-01-09
  Administered 2024-01-09: 30 mL via ORAL
  Filled 2024-01-09: qty 30

## 2024-01-09 MED ORDER — LIDOCAINE VISCOUS HCL 2 % MT SOLN
15.0000 mL | Freq: Once | OROMUCOSAL | Status: AC
  Administered 2024-01-09: 15 mL via ORAL
  Filled 2024-01-09: qty 15

## 2024-01-09 MED ORDER — SUCRALFATE 1 G PO TABS: 1.0000 g | ORAL_TABLET | Freq: Four times a day (QID) | ORAL | 1 refills | Status: DC

## 2024-01-09 NOTE — ED Provider Notes (Signed)
 [facility] Provider Note    Event Date/Time   First MD Initiated Contact with Patient 01/09/24 1430     (approximate)  History   Chief Complaint: Chest Pain  HPI  Kenneth Gibson is a 48 y.o. male with a past medical history anxiety, gastric reflux, presents to the emergency department for chest pain.  According to the patient for the last 5 or 6 months he has been experiencing intermittent pain in the center of his chest and left upper quadrant of his abdomen.  Patient states in October he had an endoscopy and colonoscopy with no findings at that time.  Patient takes Protonix for acid reflux but states this feels somewhat different.  Patient states the pain comes and goes, today seem to be more significant so he came to the emergency department for evaluation.  Patient denies any shortness of breath or diaphoresis.  Patient states the pain is worse with certain movements such as laying on his left side.  No pleuritic pain.  No recent cough.  Physical Exam   Triage Vital Signs: ED Triage Vitals  Encounter Vitals Group     BP 01/09/24 1414 138/88     Systolic BP Percentile --      Diastolic BP Percentile --      Pulse Rate 01/09/24 1414 82     Resp 01/09/24 1414 20     Temp 01/09/24 1414 98.3 F (36.8 C)     Temp Source 01/09/24 1414 Oral     SpO2 01/09/24 1414 97 %     Weight 01/09/24 1410 245 lb (111.1 kg)     Height 01/09/24 1410 5\' 10"  (1.778 m)     Head Circumference --      Peak Flow --      Pain Score 01/09/24 1411 5     Pain Loc --      Pain Education --      Exclude from Growth Chart --     Most recent vital signs: Vitals:   01/09/24 1414  BP: 138/88  Pulse: 82  Resp: 20  Temp: 98.3 F (36.8 C)  SpO2: 97%    General: Awake, no distress.  CV:  Good peripheral perfusion.  Regular rate and rhythm  Resp:  Normal effort.  Equal breath sounds bilaterally.  No chest wall tenderness. Abd:  No distention.  Soft, benign  abdomen.   ED Results / Procedures / Treatments   EKG  EKG viewed and interpreted by myself shows a sinus rhythm 81 bpm with a narrow QRS, normal axis, normal intervals, no concerning ST changes.  RADIOLOGY  I have reviewed interpreted chest x-ray images.  No consolidation seen on my evaluation.   MEDICATIONS ORDERED IN ED: Medications - No data to display   IMPRESSION / MDM / ASSESSMENT AND PLAN / ED COURSE  I reviewed the triage vital signs and the nursing notes.  Patient's presentation is most consistent with acute presentation with potential threat to life or bodily function.  Patient presents to the emergency department for intermittent left chest/left upper quadrant abdominal pain over the last 5 to 6 months.  Overall the patient appears well, no distress.  Benign abdomen, chest is nontender.  EKG is reassuring and chest x-ray appears clear.  Given the intermittent nature of the symptoms it seems as if it could be more suggestive of gastric pain such as gastritis peptic ulcer disease or intestinal spasm/gas type pain.  It is positional raising the possibility  of musculoskeletal pain.  Patient denies any family history of cardiac disease.  Patient states very slight discomfort currently especially if he lies on his left side.  We will dose a GI cocktail and reassess.  Patient takes Protonix daily already however I believe adding sucralfate  that the patient could take during intermittent episodes of discomfort could be helpful.  I have added on hepatic function panel and lipase as a precaution.  If the patient's emergency department shows no significant finding anticipate likely discharge home with outpatient cardiology follow-up for further evaluation as well as a trial of sucralfate  to see if this helps the patient symptoms.  Patient is agreeable to this plan.  FINAL CLINICAL IMPRESSION(S) / ED DIAGNOSES   Chest pain  Note:  This document was prepared using Dragon voice recognition  software and may include unintentional dictation errors.   Ruth Cove, MD 01/10/24 2356

## 2024-01-09 NOTE — ED Triage Notes (Signed)
 Pt to ED POV for intermittent L chest pain and upper back pain since >2 weeks. Pt had bronchitis in Jan and has been on 3 rounds prednisone and 3 rounds abx since then. Pt denies inspiratory chest pain but back pain is worse with lying. Also L arm tingling on and off this whole time. Ambulatory, skin dry. Pt has hemochromatosis--too much iron--goes for weekly blood draws to remove  blood.

## 2024-01-09 NOTE — Discharge Instructions (Addendum)
 Please begin taking your prescribed sucralfate  before breakfast/lunch/dinner/bedtime, as needed for symptoms.  Please follow-up with cardiology by calling the number provided to arrange further evaluation for consideration of further workup such as a stress test.  Return to the emergency department for any return of/worsening chest pain shortness of breath or any other symptom personally concerning to yourself.

## 2024-01-09 NOTE — ED Provider Notes (Signed)
 Care of this patient assumed from prior physician at 1500 pending completion of workup and disposition. Please see prior physician note for further details.  Briefly this is a 48 year old male with history of reflux who presents with chest and left upper quadrant pain.  Reassuring exam and EKG, negative troponin.  Pending LFTs, lipase, x-Marlon Suleiman read at time of signout.  Anticipated discharge if these were reassuring.  LFTs and lipase without significant derangement.  X-Aivah Putman without acute findings.  Patient reassessed.  Does feel somewhat improved.  Discussed results of workup.  He is comfortable discharge home.  Strict return precautions provided. Sucralfate  rx sent by prior physician.  Patient discharged in stable condition.   Claria Crofts, MD 01/09/24 1600

## 2024-01-11 ENCOUNTER — Inpatient Hospital Stay

## 2024-01-12 ENCOUNTER — Other Ambulatory Visit: Payer: Self-pay | Admitting: Urology

## 2024-01-14 ENCOUNTER — Other Ambulatory Visit: Payer: Self-pay

## 2024-01-14 ENCOUNTER — Other Ambulatory Visit: Payer: Self-pay | Admitting: Gastroenterology

## 2024-01-14 ENCOUNTER — Inpatient Hospital Stay

## 2024-01-14 DIAGNOSIS — R1310 Dysphagia, unspecified: Secondary | ICD-10-CM

## 2024-01-14 DIAGNOSIS — K219 Gastro-esophageal reflux disease without esophagitis: Secondary | ICD-10-CM

## 2024-01-14 LAB — CMP (CANCER CENTER ONLY)
ALT: 56 U/L — ABNORMAL HIGH (ref 0–44)
AST: 40 U/L (ref 15–41)
Albumin: 4 g/dL (ref 3.5–5.0)
Alkaline Phosphatase: 57 U/L (ref 38–126)
Anion gap: 8 (ref 5–15)
BUN: 15 mg/dL (ref 6–20)
CO2: 24 mmol/L (ref 22–32)
Calcium: 8.9 mg/dL (ref 8.9–10.3)
Chloride: 104 mmol/L (ref 98–111)
Creatinine: 0.77 mg/dL (ref 0.61–1.24)
GFR, Estimated: >60 mL/min (ref >60)
Glucose, Bld: 106 mg/dL — ABNORMAL HIGH (ref 70–99)
Potassium: 3.5 mmol/L (ref 3.5–5.1)
Sodium: 136 mmol/L (ref 135–145)
Total Bilirubin: 1.1 mg/dL (ref 0.0–1.2)
Total Protein: 7.2 g/dL (ref 6.5–8.1)

## 2024-01-14 LAB — HEMOGLOBIN AND HEMATOCRIT (CANCER CENTER ONLY)
HCT: 41.6 % (ref 39.0–52.0)
Hemoglobin: 14.5 g/dL (ref 13.0–17.0)

## 2024-01-14 LAB — FERRITIN: Ferritin: 1129 ng/mL — ABNORMAL HIGH (ref 24–336)

## 2024-01-14 NOTE — Patient Instructions (Signed)

## 2024-01-17 NOTE — Progress Notes (Signed)
 Left message for upcoming procedure Friday, Jan 21, 2024. Nothing to eat or drink after midnight Thursday. Arrival time 0800 Friday to [facility]. Will make additional attempt later this week.

## 2024-01-19 ENCOUNTER — Encounter (HOSPITAL_COMMUNITY): Payer: Self-pay | Admitting: Urology

## 2024-01-19 ENCOUNTER — Ambulatory Visit
Admission: RE | Admit: 2024-01-19 | Discharge: 2024-01-19 | Disposition: A | Discharge status: Home / Self Care | Source: Ambulatory Visit | Attending: Gastroenterology | Admitting: Gastroenterology

## 2024-01-19 DIAGNOSIS — K219 Gastro-esophageal reflux disease without esophagitis: Secondary | ICD-10-CM | POA: Diagnosis present

## 2024-01-19 DIAGNOSIS — R1310 Dysphagia, unspecified: Secondary | ICD-10-CM | POA: Diagnosis present

## 2024-01-19 NOTE — Progress Notes (Signed)
 Spoke w/ via phone for pre-op interview---Kenneth Gibson needs dos----KUB             COVID test -----patient states asymptomatic no test needed Arrive at -------0800 NPO after MN NO Solid Food.  Clear liquids from MN until--- Med rec completed. Pt aware to hold ASA/NSAIDs and supplements per [facility] protocol.  Medications to take morning of surgery -----propranolol, protonix, carafate  by 4 AM tylenol  as needed Diabetic/Weight loss medication ----- No Alcohol or recreational drugs for 24 hours/Tobacco products for 6 hours ----Stop Thursday evening Patient instructed to bring blue lithotripsy folder, photo id and insurance card day of surgery. Patient aware to have Driver (ride ) / caregiver for 24 hours after surgery -----Jennifer 336 [phone] Patient Special Instructions -----bring blue folder, wear comfortable clothig. No metal buttons,belt buckles, or zippers. Pre-Op special Instructions ----- take laxative of choice day before procedure Patient verbalized understanding of instructions that were given at this phone interview. Patient denies shortness of breath, chest pain, fever, cough at this phone interview.

## 2024-01-20 NOTE — H&P (Signed)
 Kenneth Gibson is a 48 year old male who is seen in follow with history of urolithiasis and gross hematuria.   1. Urolithiasis:  -He has a long history of urolithiasis and has had METx several. He is on Topamax.  -He presented to the ED in 12/2022 with complaints of flank pain that resolved during his ER visit. He was able to pass a stone. CT revealed no hydronephrosis or renal stones.  -He had recurrence of flank pain.  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 No other ureteral stones or hydronephrosis was identified.  -KUB 01/12/2024 with 5 mm stone in the right renal pelvis. He has had more right-sided flank pain recently. He denies fevers or chills or dysuria.   2. Gross hematuria:  - Developed gross hematuria in 04/2023 about once a month. He denies smoking history but does chew tobacco. He denies family history of urologic malignancy. He has never worked with Advice worker.  -CT A/P 9/24 with 5 mm right upper pole stone.  -Cystoscopy 07/05/2023: Unremarkable.  - Urine cytology 07/05/2023: Atypical urothelial cells. Comment: Presence of papillary groups of urothelial cells may be seen associated with a low-grade papillary lesion, stones or result of instrumentation.  - Repeat cystoscopy 01/12/2024: Unremarkable. Urine cytology pending.     ALLERGIES: None   MEDICATIONS: Viagra     GU PSH: Cystoscopy - 07/05/2023 Locm 300-399Mg /Ml Iodine,1Ml - 06/15/2023       PSH Notes: left hand surgery, surgery for kidney stones   NON-GU PSH: No Non-GU PSH    GU PMH: Gross hematuria - 07/05/2023, - 06/15/2023, - 05/14/2023, - 04/28/2023 Renal calculus (Stable) - 07/05/2023 Pelvic/perineal pain - 04/28/2023 ED due to arterial insufficiency, He would like to Cialis - 01/06/2023 History of urolithiasis, He has a history of recurrent urolithiasis, passed a stone earlier this month,  currently asymptomatic and urine is clear - 01/06/2023    NON-GU PMH: Anxiety    FAMILY HISTORY: No Family History    SOCIAL HISTORY: Marital Status: Unknown Preferred Language: English; Race: White Current Smoking Status: Patient smokes occasionally.   Tobacco Use Assessment Completed: Used Tobacco in last 30 days? Does drink.  Drinks 2 caffeinated drinks per day. Patient's occupation Scientific laboratory technician.    REVIEW OF SYSTEMS:    GU Review Male:   Patient denies frequent urination, hard to postpone urination, burning/ pain with urination, get up at night to urinate, leakage of urine, stream starts and stops, trouble starting your stream, have to strain to urinate , erection problems, and penile pain.  Gastrointestinal (Upper):   Patient denies nausea, vomiting, and indigestion/ heartburn.  Gastrointestinal (Lower):   Patient denies diarrhea and constipation.  Constitutional:   Patient denies fever, night sweats, weight loss, and fatigue.  Skin:   Patient denies skin rash/ lesion and itching.  Eyes:   Patient denies blurred vision and double vision.  Ears/ Nose/ Throat:   Patient denies sore throat and sinus problems.  Hematologic/Lymphatic:   Patient denies swollen glands and easy bruising.  Cardiovascular:   Patient denies leg swelling and chest pains.  Respiratory:   Patient denies cough and shortness of breath.  Endocrine:   Patient denies excessive thirst.  Musculoskeletal:   Patient denies back pain and joint pain.  Neurological:   Patient denies headaches and dizziness.  Psychologic:   Patient denies depression and anxiety.   VITAL SIGNS: None   MULTI-SYSTEM PHYSICAL EXAMINATION:  Constitutional: Well-nourished. No physical deformities. Normally developed. Good grooming.  Respiratory: No labored breathing, no use of accessory muscles.   Cardiovascular: Normal temperature, normal extremity pulses, no swelling, no varicosities.  Gastrointestinal: No mass, no tenderness,  no rigidity, non obese abdomen.     Complexity of Data:  Source Of History:  Patient, Medical Record Summary  Records Review:   Previous Doctor Records, Previous Patient Records  Urine Test Review:   Urinalysis  X-Ray Review: KUB: Reviewed Films. Reviewed Report. Discussed With Patient.     PROCEDURES:         KUB - [id number]  A single view of the abdomen is obtained. 5 mm stone overlying right renal pelvis.      Patient confirmed No Neulasta OnPro Device.           Visit Complexity - G2211          Urinalysis w/Scope Dipstick Dipstick Cont'd Micro  Color: Amber Bilirubin: Neg mg/dL WBC/hpf: 0 - 5/hpf  Appearance: Cloudy Ketones: Neg mg/dL RBC/hpf: >09/WJX  Specific Gravity: 1.025 Blood: 3+ ery/uL Bacteria: Few (10-25/hpf)  pH: 6.0 Protein: Trace mg/dL Cystals: NS (Not Seen)  Glucose: Neg mg/dL Urobilinogen: 0.2 mg/dL Casts: Hyaline    Nitrites: Neg Trichomonas: Not Present    Leukocyte Esterase: Neg leu/uL Mucous: Present      Epithelial Cells: 0 - 5/hpf      Yeast: NS (Not Seen)      Sperm: Not Present    ASSESSMENT:      ICD-10 Details  1 GU:   Renal calculus - N20.0   2   Gross hematuria - R31.0    PLAN:           Orders Labs Urine Cytology          Document Letter(s):  Created for Patient: Clinical Summary         Notes:    #1. Urolithiasis:  -KUB today with 5 mm right renal pelvis stone. We discussed options and he elects proceed with ESWL. Will arrange for next available.   We discussed the options for management of kidney stones, including observation, ESWL, ureteroscopy with laser lithotripsy, and PCNL. The risks and benefits of each option were discussed.  For observation I described the risks which include but are not limited to silent renal damage, life-threatening infection, need for emergent surgery, failure to pass stone, and pain.   ESWL: risks and benefits of ESWL were outlined including infection, bleeding, pain, steinstrasse, kidney injury,  need for ancillary treatments, and global anesthesia risks including but not limited to CVA, MI, DVT, PE, pneumonia, and death.   2. Gross hematuria: Cystoscopy today unremarkable. Will send urine cytology and notify result.

## 2024-01-21 ENCOUNTER — Other Ambulatory Visit: Payer: Self-pay

## 2024-01-21 ENCOUNTER — Ambulatory Visit (HOSPITAL_COMMUNITY)

## 2024-01-21 ENCOUNTER — Encounter (HOSPITAL_COMMUNITY)
Admission: RE | Disposition: A | Discharge status: Home / Self Care | Payer: Self-pay | Source: Home / Self Care | Attending: Urology

## 2024-01-21 ENCOUNTER — Ambulatory Visit (HOSPITAL_COMMUNITY)
Admission: RE | Admit: 2024-01-21 | Discharge: 2024-01-21 | Disposition: A | Discharge status: Home / Self Care | Attending: Urology | Admitting: Urology

## 2024-01-21 ENCOUNTER — Encounter (HOSPITAL_COMMUNITY): Payer: Self-pay | Admitting: Urology

## 2024-01-21 DIAGNOSIS — N200 Calculus of kidney: Secondary | ICD-10-CM | POA: Diagnosis present

## 2024-01-21 DIAGNOSIS — F17200 Nicotine dependence, unspecified, uncomplicated: Secondary | ICD-10-CM | POA: Insufficient documentation

## 2024-01-21 HISTORY — PX: EXTRACORPOREAL SHOCK WAVE LITHOTRIPSY: SHX1557

## 2024-01-21 SURGERY — LITHOTRIPSY, ESWL
Anesthesia: LOCAL | Laterality: Right

## 2024-01-21 MED ORDER — SODIUM CHLORIDE 0.9 % IV SOLN
INTRAVENOUS | Status: DC
  Administered 2024-01-21: 1000 mL via INTRAVENOUS

## 2024-01-21 MED ORDER — CIPROFLOXACIN HCL 500 MG PO TABS
ORAL_TABLET | ORAL | Status: AC
Start: 2024-01-21 — End: 2024-01-21
  Administered 2024-01-21: 500 mg
  Filled 2024-01-21: qty 1

## 2024-01-21 MED ORDER — DIPHENHYDRAMINE HCL 25 MG PO CAPS
25.0000 mg | ORAL_CAPSULE | ORAL | Status: AC
  Administered 2024-01-21: 25 mg via ORAL
  Filled 2024-01-21: qty 1

## 2024-01-21 MED ORDER — BISACODYL 5 MG PO TBEC: 5.0000 mg | DELAYED_RELEASE_TABLET | Freq: Every day | ORAL | Status: DC | PRN

## 2024-01-21 MED ORDER — HYDROCODONE-ACETAMINOPHEN 5-325 MG PO TABS: 1.0000 | ORAL_TABLET | Freq: Four times a day (QID) | ORAL | 0 refills | Status: DC | PRN

## 2024-01-21 MED ORDER — CIPROFLOXACIN IN D5W 400 MG/200ML IV SOLN
400.0000 mg | Freq: Two times a day (BID) | INTRAVENOUS | Status: DC
  Filled 2024-01-21: qty 200

## 2024-01-21 MED ORDER — TAMSULOSIN HCL 0.4 MG PO CAPS: 0.4000 mg | ORAL_CAPSULE | Freq: Every day | ORAL | 1 refills | Status: DC

## 2024-01-21 MED ORDER — DIAZEPAM 5 MG PO TABS
10.0000 mg | ORAL_TABLET | ORAL | Status: AC
  Administered 2024-01-21: 10 mg via ORAL
  Filled 2024-01-21: qty 2

## 2024-01-21 NOTE — Discharge Instructions (Signed)
 I have reviewed discharge instructions in detail with the patient. They will follow-up with me or their physician as scheduled. My nurse will also be calling the patients as per protocol.

## 2024-01-21 NOTE — Interval H&P Note (Signed)
 History and Physical Interval Note:  01/21/2024 8:36 AM  Kenneth Gibson  has presented today for surgery, with the diagnosis of RIGHT RENAL STONE.  The various methods of treatment have been discussed with the patient and family. After consideration of risks, benefits and other options for treatment, the patient has consented to  Procedure(s) with comments: LITHOTRIPSY, ESWL (Right) - RIGHT EXTRACORPOREAL SHOCKWAVE LITHOTRIPSY as a surgical intervention.  The patient's history has been reviewed, patient examined, no change in status, stable for surgery.  I have reviewed the patient's chart and labs.  Questions were answered to the patient's satisfaction.     Thurma Priego A Ashtian Villacis

## 2024-01-24 ENCOUNTER — Encounter (HOSPITAL_COMMUNITY): Payer: Self-pay | Admitting: Urology

## 2024-02-01 ENCOUNTER — Inpatient Hospital Stay

## 2024-02-01 ENCOUNTER — Inpatient Hospital Stay: Attending: Oncology

## 2024-02-01 LAB — FERRITIN: Ferritin: 1356 ng/mL — ABNORMAL HIGH (ref 24–336)

## 2024-02-01 LAB — HEMOGLOBIN AND HEMATOCRIT (CANCER CENTER ONLY)
HCT: 42.2 % (ref 39.0–52.0)
Hemoglobin: 14.9 g/dL (ref 13.0–17.0)

## 2024-02-01 NOTE — Progress Notes (Signed)
 Kenneth Gibson presents today for phlebotomy per MD orders. Phlebotomy procedure started at 1415 and ended at 1425. 500 mls removed. Patient tolerated procedure well. IV needle removed intact.

## 2024-02-01 NOTE — Patient Instructions (Signed)

## 2024-02-09 ENCOUNTER — Inpatient Hospital Stay

## 2024-02-09 LAB — HEMOGLOBIN AND HEMATOCRIT (CANCER CENTER ONLY)
HCT: 37.7 % — ABNORMAL LOW (ref 39.0–52.0)
Hemoglobin: 13.6 g/dL (ref 13.0–17.0)

## 2024-02-09 MED ORDER — SODIUM CHLORIDE 0.9 % IV SOLN
Freq: Once | INTRAVENOUS | Status: AC | PRN
  Filled 2024-02-09: qty 250

## 2024-02-09 NOTE — Progress Notes (Signed)
 Kenneth Gibson presents today for phlebotomy per MD orders. Phlebotomy procedure started at 1340 and ended at 1354. 500 mls removed. NS bolus of 500 mls given after Phlebotomy.  Patient tolerated procedure well. IV needle removed intact.

## 2024-02-09 NOTE — Patient Instructions (Signed)

## 2024-02-16 ENCOUNTER — Inpatient Hospital Stay

## 2024-02-16 LAB — HEMOGLOBIN AND HEMATOCRIT (CANCER CENTER ONLY)
HCT: 38 % — ABNORMAL LOW (ref 39.0–52.0)
Hemoglobin: 13.6 g/dL (ref 13.0–17.0)

## 2024-02-16 LAB — FERRITIN: Ferritin: 1317 ng/mL — ABNORMAL HIGH (ref 24–336)

## 2024-02-16 MED ORDER — SODIUM CHLORIDE 0.9 % IV SOLN
Freq: Once | INTRAVENOUS | Status: AC | PRN
  Filled 2024-02-16: qty 250

## 2024-02-16 NOTE — Patient Instructions (Signed)

## 2024-02-16 NOTE — Progress Notes (Signed)
 Kenneth Gibson presents today for phlebotomy per MD orders. Phlebotomy procedure started at 1405 and ended at 1420. 500 mls removed. Patient tolerated procedure well. IV needle removed intact.

## 2024-02-21 ENCOUNTER — Other Ambulatory Visit: Payer: Self-pay | Admitting: Internal Medicine

## 2024-02-21 DIAGNOSIS — R1010 Upper abdominal pain, unspecified: Secondary | ICD-10-CM

## 2024-02-21 DIAGNOSIS — I10 Essential (primary) hypertension: Secondary | ICD-10-CM

## 2024-02-22 ENCOUNTER — Other Ambulatory Visit: Payer: Self-pay

## 2024-02-22 ENCOUNTER — Inpatient Hospital Stay

## 2024-02-23 ENCOUNTER — Inpatient Hospital Stay: Attending: Oncology

## 2024-02-23 ENCOUNTER — Inpatient Hospital Stay

## 2024-02-23 LAB — CBC WITH DIFFERENTIAL (CANCER CENTER ONLY)
Abs Immature Granulocytes: 0.01 10*3/uL (ref 0.00–0.07)
Basophils Absolute: 0 10*3/uL (ref 0.0–0.1)
Basophils Relative: 1 %
Eosinophils Absolute: 0.2 10*3/uL (ref 0.0–0.5)
Eosinophils Relative: 2 %
HCT: 38.6 % — ABNORMAL LOW (ref 39.0–52.0)
Hemoglobin: 13.6 g/dL (ref 13.0–17.0)
Immature Granulocytes: 0 %
Lymphocytes Relative: 26 %
Lymphs Abs: 1.7 10*3/uL (ref 0.7–4.0)
MCH: 35.8 pg — ABNORMAL HIGH (ref 26.0–34.0)
MCHC: 35.2 g/dL (ref 30.0–36.0)
MCV: 101.6 fL — ABNORMAL HIGH (ref 80.0–100.0)
Monocytes Absolute: 0.5 10*3/uL (ref 0.1–1.0)
Monocytes Relative: 8 %
Neutro Abs: 4.1 10*3/uL (ref 1.7–7.7)
Neutrophils Relative %: 63 %
Platelet Count: 143 10*3/uL — ABNORMAL LOW (ref 150–400)
RBC: 3.8 MIL/uL — ABNORMAL LOW (ref 4.22–5.81)
RDW: 12.6 % (ref 11.5–15.5)
WBC Count: 6.6 10*3/uL (ref 4.0–10.5)
nRBC: 0 % (ref 0.0–0.2)

## 2024-02-23 LAB — FERRITIN: Ferritin: 1241 ng/mL — ABNORMAL HIGH (ref 24–336)

## 2024-02-23 LAB — IRON AND TIBC
Iron: 222 ug/dL — ABNORMAL HIGH (ref 45–182)
Saturation Ratios: 90 % — ABNORMAL HIGH (ref 17.9–39.5)
TIBC: 246 ug/dL — ABNORMAL LOW (ref 250–450)
UIBC: 24 ug/dL

## 2024-02-23 LAB — CMP (CANCER CENTER ONLY)
ALT: 50 U/L — ABNORMAL HIGH (ref 0–44)
AST: 33 U/L (ref 15–41)
Albumin: 4 g/dL (ref 3.5–5.0)
Alkaline Phosphatase: 62 U/L (ref 38–126)
Anion gap: 6 (ref 5–15)
BUN: 15 mg/dL (ref 6–20)
CO2: 24 mmol/L (ref 22–32)
Calcium: 8.7 mg/dL — ABNORMAL LOW (ref 8.9–10.3)
Chloride: 108 mmol/L (ref 98–111)
Creatinine: 0.87 mg/dL (ref 0.61–1.24)
GFR, Estimated: >60 mL/min (ref >60)
Glucose, Bld: 106 mg/dL — ABNORMAL HIGH (ref 70–99)
Potassium: 3.7 mmol/L (ref 3.5–5.1)
Sodium: 138 mmol/L (ref 135–145)
Total Bilirubin: 1 mg/dL (ref 0.0–1.2)
Total Protein: 7 g/dL (ref 6.5–8.1)

## 2024-02-23 MED ORDER — SODIUM CHLORIDE 0.9 % IV SOLN
Freq: Once | INTRAVENOUS | Status: AC | PRN
  Filled 2024-02-23: qty 250

## 2024-02-23 NOTE — Progress Notes (Signed)
 Kenneth Gibson presents today for phlebotomy per MD orders. Phlebotomy procedure started at 1455 and ended at 1509. 500 ml removed. Patient tolerated procedure well. IV needle removed intact.

## 2024-02-23 NOTE — Patient Instructions (Signed)

## 2024-02-24 ENCOUNTER — Ambulatory Visit
Admission: RE | Admit: 2024-02-24 | Discharge: 2024-02-24 | Disposition: A | Discharge status: Home / Self Care | Source: Ambulatory Visit | Attending: Internal Medicine | Admitting: Internal Medicine

## 2024-02-24 DIAGNOSIS — I10 Essential (primary) hypertension: Secondary | ICD-10-CM | POA: Diagnosis present

## 2024-02-24 DIAGNOSIS — R1010 Upper abdominal pain, unspecified: Secondary | ICD-10-CM | POA: Insufficient documentation

## 2024-03-16 ENCOUNTER — Other Ambulatory Visit: Payer: Self-pay

## 2024-03-17 ENCOUNTER — Encounter: Payer: Self-pay | Admitting: Oncology

## 2024-03-17 ENCOUNTER — Inpatient Hospital Stay (HOSPITAL_BASED_OUTPATIENT_CLINIC_OR_DEPARTMENT_OTHER): Payer: 59 | Admitting: Oncology

## 2024-03-17 ENCOUNTER — Inpatient Hospital Stay: Payer: 59

## 2024-03-17 ENCOUNTER — Inpatient Hospital Stay

## 2024-03-17 LAB — CBC WITH DIFFERENTIAL (CANCER CENTER ONLY)
Abs Immature Granulocytes: 0.01 10*3/uL (ref 0.00–0.07)
Basophils Absolute: 0 10*3/uL (ref 0.0–0.1)
Basophils Relative: 1 %
Eosinophils Absolute: 0.1 10*3/uL (ref 0.0–0.5)
Eosinophils Relative: 3 %
HCT: 39.7 % (ref 39.0–52.0)
Hemoglobin: 14.5 g/dL (ref 13.0–17.0)
Immature Granulocytes: 0 %
Lymphocytes Relative: 32 %
Lymphs Abs: 1.7 10*3/uL (ref 0.7–4.0)
MCH: 36.3 pg — ABNORMAL HIGH (ref 26.0–34.0)
MCHC: 36.5 g/dL — ABNORMAL HIGH (ref 30.0–36.0)
MCV: 99.5 fL (ref 80.0–100.0)
Monocytes Absolute: 0.3 10*3/uL (ref 0.1–1.0)
Monocytes Relative: 6 %
Neutro Abs: 3.1 10*3/uL (ref 1.7–7.7)
Neutrophils Relative %: 58 %
Platelet Count: 147 10*3/uL — ABNORMAL LOW (ref 150–400)
RBC: 3.99 MIL/uL — ABNORMAL LOW (ref 4.22–5.81)
RDW: 11.9 % (ref 11.5–15.5)
WBC Count: 5.3 10*3/uL (ref 4.0–10.5)
nRBC: 0 % (ref 0.0–0.2)

## 2024-03-17 LAB — IRON AND TIBC
Iron: 218 ug/dL — ABNORMAL HIGH (ref 45–182)
Saturation Ratios: 91 % — ABNORMAL HIGH (ref 17.9–39.5)
TIBC: 239 ug/dL — ABNORMAL LOW (ref 250–450)
UIBC: 21 ug/dL

## 2024-03-17 LAB — CMP (CANCER CENTER ONLY)
ALT: 75 U/L — ABNORMAL HIGH (ref 0–44)
AST: 42 U/L — ABNORMAL HIGH (ref 15–41)
Albumin: 3.9 g/dL (ref 3.5–5.0)
Alkaline Phosphatase: 78 U/L (ref 38–126)
Anion gap: 7 (ref 5–15)
BUN: 13 mg/dL (ref 6–20)
CO2: 22 mmol/L (ref 22–32)
Calcium: 8.7 mg/dL — ABNORMAL LOW (ref 8.9–10.3)
Chloride: 108 mmol/L (ref 98–111)
Creatinine: 0.8 mg/dL (ref 0.61–1.24)
GFR, Estimated: >60 mL/min (ref >60)
Glucose, Bld: 143 mg/dL — ABNORMAL HIGH (ref 70–99)
Potassium: 3.5 mmol/L (ref 3.5–5.1)
Sodium: 137 mmol/L (ref 135–145)
Total Bilirubin: 0.9 mg/dL (ref 0.0–1.2)
Total Protein: 6.8 g/dL (ref 6.5–8.1)

## 2024-03-17 LAB — FERRITIN: Ferritin: 1118 ng/mL — ABNORMAL HIGH (ref 24–336)

## 2024-03-17 MED ORDER — SODIUM CHLORIDE 0.9 % IV SOLN
Freq: Once | INTRAVENOUS | Status: AC | PRN
  Filled 2024-03-17: qty 250

## 2024-03-17 NOTE — Patient Instructions (Signed)

## 2024-03-17 NOTE — Progress Notes (Signed)
 Kenneth Gibson presents today for phlebotomy per MD orders. Phlebotomy procedure started at 1455 and ended at 1520. 350 mls removed slow flow stopped at 350 and started IVF NS 500mls.  Patient tolerated procedure well. IV needle removed intact.

## 2024-03-17 NOTE — Progress Notes (Unsigned)
 Patient had a kidney stone recently, ever since then he doesn't feel that good.

## 2024-03-19 ENCOUNTER — Encounter: Payer: Self-pay | Admitting: Oncology

## 2024-03-19 NOTE — Progress Notes (Signed)
 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 His past medical history is significant for ADHD and anxiety. He has symptoms of epigastric pain and chest wall pain. He underwent a complete cardiology evaluation including a nuclear stress test which was unremarkable. 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 CMP showed elevated AST and ALT of 92 and 177 respectively with a total normal bilirubin. Iron studies showed ferritin levels greater than 1500 with an iron saturation of 99%. Hepatitis B and C testing were negative. CBC in the past has been normal. Patient drinks about 1 beer per day   Interval history-he is doing well presently.  tolerating phlebotomy well so far.  He is working on cutting down his alcohol intake.  He has been having occasional left upper quadrant abdominal pain which comes and goes.  ECOG PS- 0 Pain scale- 3   Review of systems- Review of Systems  Constitutional:  Negative for chills, fever, malaise/fatigue and weight loss.  HENT:  Negative for congestion, ear discharge and nosebleeds.   Eyes:  Negative for blurred vision.  Respiratory:  Negative for cough, hemoptysis, sputum production, shortness of breath and wheezing.   Cardiovascular:  Negative for chest pain, palpitations, orthopnea and claudication.  Gastrointestinal:  Positive for  abdominal pain. Negative for blood in stool, constipation, diarrhea, heartburn, melena, nausea and vomiting.  Genitourinary:  Negative for dysuria, flank pain, frequency, hematuria and urgency.  Musculoskeletal:  Negative for back pain, joint pain and myalgias.  Skin:  Negative for rash.  Neurological:  Negative for dizziness, tingling, focal weakness, seizures, weakness and headaches.  Endo/Heme/Allergies:  Does not bruise/bleed easily.  Psychiatric/Behavioral:  Negative for depression and suicidal ideas. The patient does not have insomnia.       Allergies  Allergen Reactions   Penicillins Other (See Comments) and Rash    Unknown reaction. Had as a child  unknown  unknown  Unknown reaction. Had as a child  unknown  Other Reaction(s): Not available     Past Medical History:  Diagnosis Date   ADHD    Anxiety    Bronchitis    GERD (gastroesophageal reflux disease)    Hemochromatosis    History of kidney stones      Past Surgical History:  Procedure Laterality Date   BELPHAROPTOSIS REPAIR     EXTRACORPOREAL SHOCK WAVE LITHOTRIPSY  2013   EXTRACORPOREAL SHOCK WAVE LITHOTRIPSY Right 01/21/2024   Procedure: LITHOTRIPSY, ESWL;  Surgeon: Gaston Hamilton, MD;  Location: [facility];  Service: Urology;  Laterality: Right;  RIGHT EXTRACORPOREAL SHOCKWAVE LITHOTRIPSY   GANGLION CYST EXCISION Left 10/17/2020   Procedure: REMOVAL GANGLION OF WRIST;  Surgeon: Cleotilde Barrio, MD;  Location: [facility] ORS;  Service: Orthopedics;  Laterality: Left;    Social History   Socioeconomic History   Marital status: Married    Spouse name: Not on file   Number of children: 2   Years of education: Not on file   Highest  education level: Not on file  Occupational History   Not on file  Tobacco Use   Smoking status: Never   Smokeless tobacco: Current    Types: Snuff  Vaping Use   Vaping status: Never Used  Substance and Sexual Activity   Alcohol use: Yes    Alcohol/week: 6.0 standard drinks of  alcohol    Types: 6 Cans of beer per week    Comment: social   Drug use: No   Sexual activity: Yes  Other Topics Concern   Not on file  Social History Narrative   Not on file   Social Drivers of Health   Financial Resource Strain: Low Risk  (02/22/2024)   Received from [facility]   Overall Financial Resource Strain (CARDIA)    Difficulty of Paying Living Expenses: Not very hard  Food Insecurity: No Food Insecurity (02/22/2024)   Received from [facility]   Hunger Vital Sign    Within the past 12 months, you worried that your food would run out before you got the money to buy more.: Never true    Within the past 12 months, the food you bought just didn't last and you didn't have money to get more.: Never true  Transportation Needs: No Transportation Needs (02/22/2024)   Received from [facility] - Transportation    In the past 12 months, has lack of transportation kept you from medical appointments or from getting medications?: No    Lack of Transportation (Non-Medical): No  Physical Activity: Not on file  Stress: Not on file  Social Connections: Not on file  Intimate Partner Violence: Not At Risk (06/29/2023)   Humiliation, Afraid, Rape, and Kick questionnaire    Fear of Current or Ex-Partner: No    Emotionally Abused: No    Physically Abused: No    Sexually Abused: No    History reviewed. No pertinent family history.   Current Outpatient Medications:    pantoprazole (PROTONIX) 40 MG tablet, Take 40 mg by mouth daily., Disp: , Rfl:    propranolol ER (INDERAL LA) 60 MG 24 hr capsule, Take 1 tablet by mouth daily., Disp: , Rfl:    tamsulosin  (FLOMAX ) 0.4 MG CAPS capsule, Take 1 capsule (0.4 mg total) by mouth daily., Disp: 21 capsule, Rfl: 1   famotidine  (PEPCID ) 40 MG tablet, Take 1 tablet by mouth at bedtime. (Patient not taking: Reported on 03/17/2024), Disp: , Rfl:    HYDROcodone -acetaminophen  (NORCO/VICODIN)  5-325 MG tablet, Take 1-2 tablets by mouth every 6 (six) hours as needed for moderate pain (pain score 4-6). (Patient not taking: Reported on 03/17/2024), Disp: 14 tablet, Rfl: 0   ibuprofen (ADVIL) 400 MG tablet, Take 400 mg by mouth 2 (two) times daily as needed. (Patient not taking: Reported on 03/17/2024), Disp: , Rfl:    lisdexamfetamine (VYVANSE) 50 MG capsule, Take by mouth. (Patient not taking: Reported on 03/17/2024), Disp: , Rfl:    oxyCODONE-acetaminophen  (PERCOCET/ROXICET) 5-325 MG tablet, Take 1 tablet by mouth every 6 (six) hours as needed for severe pain (pain score 7-10). (Patient not taking: Reported on 03/17/2024), Disp: , Rfl:    sucralfate  (CARAFATE ) 1 g tablet, Take 1 tablet (1 g total) by mouth 4 (four) times daily. (Patient not taking: Reported on 03/17/2024), Disp: 120 tablet, Rfl: 1  Physical exam:  Vitals:   03/17/24 1418 03/17/24 1423  BP: (!) 141/99 (!) 140/88  Pulse: 97   Resp: 18   Temp: 99  F (37.2 C)   TempSrc: Tympanic   SpO2: 99%   Weight: 250 lb (113.4 kg)   Height: 5' 10 (1.778 m)    Physical Exam  Cardiovascular:     Rate and Rhythm: Normal rate and regular rhythm.     Heart sounds: Normal heart sounds.  Pulmonary:     Effort: Pulmonary effort is normal.     Breath sounds: Normal breath sounds.  Abdominal:     General: Bowel sounds are normal. There is no distension.     Palpations: Abdomen is soft.     Tenderness: There is no abdominal tenderness.   Skin:    General: Skin is warm and dry.   Neurological:     Mental Status: He is alert and oriented to person, place, and time.      I have personally reviewed labs listed below:    Latest Ref Rng & Units 03/17/2024    2:03 PM  CMP  Glucose 70 - 99 mg/dL 856   BUN 6 - 20 mg/dL 13   Creatinine 9.38 - 1.24 mg/dL 9.19   Sodium 864 - 854 mmol/L 137   Potassium 3.5 - 5.1 mmol/L 3.5   Chloride 98 - 111 mmol/L 108   CO2 22 - 32 mmol/L 22   Calcium 8.9 - 10.3 mg/dL 8.7   Total Protein 6.5 - 8.1  g/dL 6.8   Total Bilirubin 0.0 - 1.2 mg/dL 0.9   Alkaline Phos 38 - 126 U/L 78   AST 15 - 41 U/L 42   ALT 0 - 44 U/L 75       Latest Ref Rng & Units 03/17/2024    2:02 PM  CBC  WBC 4.0 - 10.5 K/uL 5.3   Hemoglobin 13.0 - 17.0 g/dL 85.4   Hematocrit 60.9 - 52.0 % 39.7   Platelets 150 - 400 K/uL 147    I have personally reviewed Radiology images listed below: No images are attached to the encounter.  CT ABDOMEN PELVIS WO CONTRAST Result Date: 02/24/2024 EXAMINATION: CT ABDOMEN PELVIS WO CONTRAST CLINICAL INDICATION: Male, 48 years old. Left-sided upper abdominal pain TECHNIQUE: Helical CT scan examination of the abdomen and pelvis is performed from the domes of the diaphragm to the pubic symphysis. Limited evaluation of the solid organs due to lack of intravenous contrast. Unless otherwise specified, incidental thyroid , adrenal, renal lesions do not require dedicated imaging follow up. Additionally, any mentioned pulmonary nodules do not require dedicated imaging follow-up based on the Fleischner guidelines unless otherwise specified. Coronary calcifications are not identified unless otherwise specified. COMPARISON: 10/05/2023 FINDINGS: The lung bases are clear. The heart is normal in size. The liver appears normal. Gallbladder is normal. The spleen is normal. The pancreas is normal. The adrenals are normal. The kidneys are normal. The abdominal aorta is normal in caliber. The urinary bladder is normal. The prostate is normal. The appendix is normal. Large and small bowel loops are otherwise within normal limits. There is no free fluid or pathologic lymphadenopathy by size criteria. There are mild degenerative changes of the spine and bony pelvis. IMPRESSION: No acute findings in the abdomen or pelvis. DOSE REDUCTION: This exam was performed according to our departmental dose-optimization program which includes automated exposure control, adjustment of the mA and/or kV according to patient size and/or  use of iterative reconstruction technique. Electronically signed by: [place] Engel MD 02/24/2024 12:14 PM EDT RP Workstation: [id number]     Assessment and plan- Patient is a 48 y.o. 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  Hemoglobin remained stable between 13-14.  Ferritin levels are slowly coming down from greater than 3500 presently to 1118.  Goal is to continue phlebotomies to bring down his ferritin to less than 100.  He is willing to try phlebotomy twice a week next week to see if we can get this done sooner.  AST and ALT fluctuate and are mildly abnormal today but total bilirubin is normal.  Continue to monitor he had a CT abdomen pelvis without contrast in June 2025 for left upper quadrant abdominal pain which was overall unremarkable.  Phlebotomy today. And twice weekly next week. H/H and phlebotomy subsequently once a week. Cbc ferritin and iron studies and cmp in 3 months and see me   Visit Diagnosis 1. Hereditary hemochromatosis (HCC)      Dr. Annah Skene, MD, MPH [facility] at [facility] [phone] 03/19/2024 9:04 AM

## 2024-03-21 ENCOUNTER — Inpatient Hospital Stay

## 2024-03-23 ENCOUNTER — Inpatient Hospital Stay: Attending: Oncology

## 2024-03-23 ENCOUNTER — Encounter

## 2024-03-23 MED ORDER — SODIUM CHLORIDE 0.9 % IV SOLN
Freq: Once | INTRAVENOUS | Status: AC | PRN
  Filled 2024-03-23: qty 250

## 2024-03-23 NOTE — Patient Instructions (Signed)

## 2024-03-23 NOTE — Progress Notes (Signed)
 Kenneth Gibson presents today for phlebotomy per MD orders. Phlebotomy procedure started at 1118 and ended at 1130. 500 mls removed. Patient tolerated procedure well. IV needle removed intact.

## 2024-03-28 ENCOUNTER — Ambulatory Visit: Payer: Self-pay | Admitting: Oncology

## 2024-03-28 ENCOUNTER — Other Ambulatory Visit: Payer: Self-pay | Admitting: *Deleted

## 2024-03-28 ENCOUNTER — Inpatient Hospital Stay

## 2024-03-28 LAB — FERRITIN: Ferritin: 1159 ng/mL — ABNORMAL HIGH (ref 24–336)

## 2024-03-28 LAB — HEMOGLOBIN AND HEMATOCRIT (CANCER CENTER ONLY)
HCT: 38.1 % — ABNORMAL LOW (ref 39.0–52.0)
Hemoglobin: 13.7 g/dL (ref 13.0–17.0)

## 2024-03-30 ENCOUNTER — Inpatient Hospital Stay

## 2024-03-30 MED ORDER — SODIUM CHLORIDE 0.9 % IV SOLN
Freq: Once | INTRAVENOUS | Status: AC | PRN
  Filled 2024-03-30: qty 250

## 2024-03-30 NOTE — Progress Notes (Signed)
 Kenneth Gibson presents today for phlebotomy per MD orders. Phlebotomy procedure started at 0845 and ended at 0928. 500 mls removed. Patient tolerated procedure well. IV needle removed intact.

## 2024-03-30 NOTE — Patient Instructions (Signed)

## 2024-03-31 ENCOUNTER — Other Ambulatory Visit

## 2024-03-31 ENCOUNTER — Encounter

## 2024-04-04 ENCOUNTER — Inpatient Hospital Stay

## 2024-04-04 LAB — HEMOGLOBIN AND HEMATOCRIT (CANCER CENTER ONLY)
HCT: 33.6 % — ABNORMAL LOW (ref 39.0–52.0)
Hemoglobin: 12.1 g/dL — ABNORMAL LOW (ref 13.0–17.0)

## 2024-04-04 MED ORDER — SODIUM CHLORIDE 0.9 % IV SOLN
Freq: Once | INTRAVENOUS | Status: AC | PRN
  Filled 2024-04-04: qty 250

## 2024-04-04 NOTE — Patient Instructions (Signed)

## 2024-04-04 NOTE — Progress Notes (Signed)
 Kenneth Gibson presents today for phlebotomy per MD orders. Phlebotomy procedure started at 0845 and ended at 0856. removed. Patient tolerated procedure well. IV needle removed intact.

## 2024-04-11 ENCOUNTER — Inpatient Hospital Stay

## 2024-04-11 LAB — HEMOGLOBIN AND HEMATOCRIT (CANCER CENTER ONLY)
HCT: 37 % — ABNORMAL LOW (ref 39.0–52.0)
Hemoglobin: 13.2 g/dL (ref 13.0–17.0)

## 2024-04-11 MED ORDER — SODIUM CHLORIDE 0.9 % IV SOLN
Freq: Once | INTRAVENOUS | Status: DC | PRN
  Filled 2024-04-11: qty 250

## 2024-04-11 NOTE — Progress Notes (Signed)
 Kenneth Gibson presents today for phlebotomy per MD orders. Phlebotomy procedure started at 0920 and ended at 0940. 500 mls removed. Patient tolerated procedure well. IV needle removed intact.

## 2024-04-11 NOTE — Patient Instructions (Signed)

## 2024-04-17 ENCOUNTER — Inpatient Hospital Stay

## 2024-04-17 ENCOUNTER — Other Ambulatory Visit

## 2024-04-27 ENCOUNTER — Inpatient Hospital Stay: Attending: Oncology

## 2024-04-27 ENCOUNTER — Inpatient Hospital Stay

## 2024-04-27 LAB — HEMOGLOBIN AND HEMATOCRIT (CANCER CENTER ONLY)
HCT: 39.2 % (ref 39.0–52.0)
Hemoglobin: 14.1 g/dL (ref 13.0–17.0)

## 2024-04-27 LAB — FERRITIN: Ferritin: 1074 ng/mL — ABNORMAL HIGH (ref 24–336)

## 2024-04-27 NOTE — Progress Notes (Signed)
 Kenneth Gibson presents today for phlebotomy per MD orders. Phlebotomy procedure started at 0902 and ended at 0912. 500 mls removed. Patient tolerated procedure well. IV needle removed intact.

## 2024-04-27 NOTE — Patient Instructions (Signed)

## 2024-05-02 ENCOUNTER — Inpatient Hospital Stay

## 2024-05-02 LAB — HEMOGLOBIN AND HEMATOCRIT (CANCER CENTER ONLY)
HCT: 41.2 % (ref 39.0–52.0)
Hemoglobin: 14.7 g/dL (ref 13.0–17.0)

## 2024-05-02 MED ORDER — SODIUM CHLORIDE 0.9 % IV SOLN
Freq: Once | INTRAVENOUS | Status: AC | PRN
  Filled 2024-05-02: qty 250

## 2024-05-02 NOTE — Progress Notes (Signed)
 Kenneth Gibson presents today for phlebotomy per MD orders. Phlebotomy procedure started at 1328 and ended at 1346. 500 mls removed. Patient tolerated procedure well. IV needle removed intact.

## 2024-05-02 NOTE — Patient Instructions (Signed)

## 2024-05-09 ENCOUNTER — Inpatient Hospital Stay

## 2024-05-12 ENCOUNTER — Inpatient Hospital Stay

## 2024-05-12 LAB — HEMOGLOBIN AND HEMATOCRIT (CANCER CENTER ONLY)
HCT: 39.5 % (ref 39.0–52.0)
Hemoglobin: 14.6 g/dL (ref 13.0–17.0)

## 2024-05-12 MED ORDER — SODIUM CHLORIDE 0.9 % IV SOLN
Freq: Once | INTRAVENOUS | Status: DC | PRN
  Filled 2024-05-12: qty 250

## 2024-05-12 NOTE — Patient Instructions (Signed)

## 2024-05-12 NOTE — Progress Notes (Signed)
 Kenneth Gibson presents today for phlebotomy per MD orders. Phlebotomy procedure started at 1425 and ended at 1436. 500 mls  removed. Patient tolerated procedure well. IV needle removed intact.

## 2024-05-16 ENCOUNTER — Inpatient Hospital Stay

## 2024-05-16 LAB — HEMOGLOBIN AND HEMATOCRIT (CANCER CENTER ONLY)
HCT: 38.3 % — ABNORMAL LOW (ref 39.0–52.0)
Hemoglobin: 13.9 g/dL (ref 13.0–17.0)

## 2024-05-16 LAB — FERRITIN: Ferritin: 928 ng/mL — ABNORMAL HIGH (ref 24–336)

## 2024-05-16 MED ORDER — SODIUM CHLORIDE 0.9 % IV SOLN
Freq: Once | INTRAVENOUS | Status: AC | PRN
  Filled 2024-05-16: qty 250

## 2024-05-16 NOTE — Progress Notes (Signed)
 Kenneth Gibson presents today for phlebotomy per MD orders. Phlebotomy procedure started at 1520 and ended at 1532. 500 mls removed. Patient tolerated procedure well. IV needle removed intact.

## 2024-05-16 NOTE — Patient Instructions (Signed)

## 2024-05-24 ENCOUNTER — Other Ambulatory Visit: Payer: Self-pay | Admitting: Physician Assistant

## 2024-05-24 DIAGNOSIS — G8929 Other chronic pain: Secondary | ICD-10-CM

## 2024-05-26 ENCOUNTER — Ambulatory Visit
Admission: RE | Admit: 2024-05-26 | Discharge: 2024-05-26 | Disposition: A | Discharge status: Home / Self Care | Source: Ambulatory Visit | Attending: Physician Assistant | Admitting: Physician Assistant

## 2024-05-26 DIAGNOSIS — M549 Dorsalgia, unspecified: Secondary | ICD-10-CM | POA: Diagnosis present

## 2024-05-26 DIAGNOSIS — G8929 Other chronic pain: Secondary | ICD-10-CM | POA: Insufficient documentation

## 2024-06-06 ENCOUNTER — Inpatient Hospital Stay: Attending: Oncology

## 2024-06-06 ENCOUNTER — Inpatient Hospital Stay

## 2024-06-06 LAB — HEMOGLOBIN AND HEMATOCRIT (CANCER CENTER ONLY)
HCT: 42.3 % (ref 39.0–52.0)
Hemoglobin: 15.7 g/dL (ref 13.0–17.0)

## 2024-06-06 MED ORDER — SODIUM CHLORIDE 0.9 % IV SOLN
Freq: Once | INTRAVENOUS | Status: AC | PRN
  Filled 2024-06-06: qty 250

## 2024-06-07 ENCOUNTER — Telehealth: Payer: Self-pay

## 2024-06-07 ENCOUNTER — Ambulatory Visit
Admission: EM | Admit: 2024-06-07 | Discharge: 2024-06-07 | Disposition: A | Discharge status: Home / Self Care | Attending: Family Medicine | Admitting: Family Medicine

## 2024-06-07 DIAGNOSIS — H65192 Other acute nonsuppurative otitis media, left ear: Secondary | ICD-10-CM

## 2024-06-07 DIAGNOSIS — H6993 Unspecified Eustachian tube disorder, bilateral: Secondary | ICD-10-CM

## 2024-06-07 DIAGNOSIS — J309 Allergic rhinitis, unspecified: Secondary | ICD-10-CM | POA: Diagnosis not present

## 2024-06-07 MED ORDER — CETIRIZINE HCL 10 MG PO TABS: 10.0000 mg | ORAL_TABLET | Freq: Every day | ORAL | 0 refills | Status: AC

## 2024-06-07 MED ORDER — PREDNISONE 20 MG PO TABS: ORAL_TABLET | ORAL | 0 refills | Status: DC

## 2024-06-07 MED ORDER — CEFDINIR 300 MG PO CAPS
300.0000 mg | ORAL_CAPSULE | Freq: Two times a day (BID) | ORAL | 0 refills | Status: DC
Start: 2024-06-07 — End: 2024-07-02

## 2024-06-07 NOTE — Telephone Encounter (Signed)
 Kenneth Gibson , pharmacist at [facility] called with questions r/t to rx omnicef  and PCN allergy-Mani, PA C made aware-reviewed chart and states ok to continue rx omnicef  due to PCN allergy was not an anaphylaxis reaction-Kenneth Gibson  made aware

## 2024-06-07 NOTE — Discharge Instructions (Addendum)
 We will manage this as a left ear infection with cefdinir . This comes from allergies, viruses and eustachian tube dysfunction. For sore throat or cough try using a honey-based tea. Use 3 teaspoons of honey with juice squeezed from half lemon. Place shaved pieces of ginger into 1/2-1 cup of water and warm over stove top. Then mix the ingredients and repeat every 4 hours as needed. Please take Tylenol  500mg -650mg  every 6 hours for throat pain, fevers, aches and pains. Hydrate very well with at least 2 liters of water. Eat light meals such as soups (chicken and noodles, vegetable, chicken and wild rice).  Do not eat foods that you are allergic to.  Taking an antihistamine like Zyrtec  can help against postnasal drainage, sinus congestion which can cause sinus pain, sinus headaches, throat pain, painful swallowing, coughing.  You can take this together with prednisone  to help your inner ear tube dysfunction flare and allergic rhinitis.

## 2024-06-07 NOTE — ED Triage Notes (Signed)
 Pt c/o ringing in ears x 3 days and possible sinus infection x 6 days-taking sinus medicine-NAD-steady gait

## 2024-06-07 NOTE — ED Provider Notes (Signed)
 [facility]  Note:  This document was prepared using Conservation officer, historic buildings and may include unintentional dictation errors.  MRN: [id number] DOB: [birth year]  Subjective:   Kenneth Gibson is a 48 y.o. male presenting for 6 day history of a sinus flare.  Has been taking over-the-counter sinus medication and has improved.  Unfortunately both of his ears have steadily worsened including fullness, intermittent pain, ringing.  No chest pain, shortness of breath or wheezing.  No smoking of any kind including cigarettes, cigars, vaping, marijuana use.  Has previously done well with prednisone  for his sinuses and allergies, bronchitis.  No current facility-administered medications for this encounter.  Current Outpatient Medications:    famotidine  (PEPCID ) 40 MG tablet, Take 1 tablet by mouth at bedtime. (Patient not taking: Reported on 03/17/2024), Disp: , Rfl:    HYDROcodone -acetaminophen  (NORCO/VICODIN) 5-325 MG tablet, Take 1-2 tablets by mouth every 6 (six) hours as needed for moderate pain (pain score 4-6). (Patient not taking: Reported on 03/17/2024), Disp: 14 tablet, Rfl: 0   ibuprofen (ADVIL) 400 MG tablet, Take 400 mg by mouth 2 (two) times daily as needed. (Patient not taking: Reported on 03/17/2024), Disp: , Rfl:    lisdexamfetamine (VYVANSE) 50 MG capsule, Take by mouth. (Patient not taking: Reported on 03/17/2024), Disp: , Rfl:    oxyCODONE-acetaminophen  (PERCOCET/ROXICET) 5-325 MG tablet, Take 1 tablet by mouth every 6 (six) hours as needed for severe pain (pain score 7-10). (Patient not taking: Reported on 03/17/2024), Disp: , Rfl:    pantoprazole (PROTONIX) 40 MG tablet, Take 40 mg by mouth daily., Disp: , Rfl:    propranolol ER (INDERAL LA) 60 MG 24 hr capsule, Take 1 tablet by mouth daily., Disp: , Rfl:    sucralfate  (CARAFATE ) 1 g tablet, Take 1 tablet (1 g total) by mouth 4 (four) times daily. (Patient not taking: Reported on 03/17/2024), Disp:  120 tablet, Rfl: 1   tamsulosin  (FLOMAX ) 0.4 MG CAPS capsule, Take 1 capsule (0.4 mg total) by mouth daily., Disp: 21 capsule, Rfl: 1   Allergies  Allergen Reactions   Penicillins Other (See Comments) and Rash    Unknown reaction. Had as a child  unknown  unknown  Unknown reaction. Had as a child  unknown  Other Reaction(s): Not available    Past Medical History:  Diagnosis Date   ADHD    Anxiety    Bronchitis    GERD (gastroesophageal reflux disease)    Hemochromatosis    History of kidney stones      Past Surgical History:  Procedure Laterality Date   BELPHAROPTOSIS REPAIR     EXTRACORPOREAL SHOCK WAVE LITHOTRIPSY  2013   EXTRACORPOREAL SHOCK WAVE LITHOTRIPSY Right 01/21/2024   Procedure: LITHOTRIPSY, ESWL;  Surgeon: Gaston Hamilton, MD;  Location: [facility];  Service: Urology;  Laterality: Right;  RIGHT EXTRACORPOREAL SHOCKWAVE LITHOTRIPSY   GANGLION CYST EXCISION Left 10/17/2020   Procedure: REMOVAL GANGLION OF WRIST;  Surgeon: Cleotilde Barrio, MD;  Location: [facility];  Service: Orthopedics;  Laterality: Left;    No family history on file.  Social History   Tobacco Use   Smoking status: Never   Smokeless tobacco: Current    Types: Snuff  Vaping Use   Vaping status: Never Used  Substance Use Topics   Alcohol use: Yes    Alcohol/week: 6.0 standard drinks of alcohol    Types: 6 Cans of beer per week    Comment: social   Drug use: No  ROS   Objective:   Vitals: BP 121/82 (BP Location: Left Arm)   Pulse 79   Temp 98.5 F (36.9 C) (Oral)   Resp 20   SpO2 96%   Physical Exam Constitutional:      General: He is not in acute distress.    Appearance: Normal appearance. He is well-developed and normal weight. He is not ill-appearing, toxic-appearing or diaphoretic.  HENT:     Head: Normocephalic and atraumatic.     Right Ear: Tympanic membrane, ear canal and external ear normal. No drainage, swelling or tenderness. No middle ear effusion. There is no  impacted cerumen. Tympanic membrane is not injected, erythematous or bulging.     Left Ear: Ear canal and external ear normal. No drainage, swelling or tenderness.  No middle ear effusion. There is no impacted cerumen. Tympanic membrane is injected, erythematous and bulging.     Nose: Congestion present. No rhinorrhea.     Mouth/Throat:     Mouth: Mucous membranes are moist.     Pharynx: No oropharyngeal exudate or posterior oropharyngeal erythema.  Eyes:     General: No scleral icterus.       Right eye: No discharge.        Left eye: No discharge.     Extraocular Movements: Extraocular movements intact.     Conjunctiva/sclera: Conjunctivae normal.  Cardiovascular:     Rate and Rhythm: Normal rate.  Pulmonary:     Effort: Pulmonary effort is normal.  Musculoskeletal:     Cervical back: Normal range of motion and neck supple. No rigidity. No muscular tenderness.  Neurological:     General: No focal deficit present.     Mental Status: He is alert and oriented to person, place, and time.  Psychiatric:        Mood and Affect: Mood normal.        Behavior: Behavior normal.        Thought Content: Thought content normal.        Judgment: Judgment normal.     Results for orders placed or performed in visit on 06/06/24 (from the past 24 hours)  Hemoglobin and Hematocrit (Cancer Center Only)     Status: None   Collection Time: 06/06/24 12:26 PM  Result Value Ref Range   Hemoglobin 15.7 13.0 - 17.0 g/dL   HCT 57.6 60.9 - 47.9 %    Assessment and Plan :   PDMP not reviewed this encounter.  1. Other non-recurrent acute nonsuppurative otitis media of left ear   2. Eustachian tube dysfunction, bilateral   3. Allergic rhinitis, unspecified seasonality, unspecified trigger     Will manage for secondary otitis media from eustachian tube dysfunction, allergic rhinitis.  Recommended cefdinir  prednisone  for the former and latter.  Counseled patient on potential for adverse effects with  medications prescribed/recommended today, ER and return-to-clinic precautions discussed, patient verbalized understanding.    Tomio Savannah, PA-C 06/07/24 1037

## 2024-06-19 ENCOUNTER — Inpatient Hospital Stay: Admitting: Oncology

## 2024-06-19 ENCOUNTER — Encounter: Payer: Self-pay | Admitting: Oncology

## 2024-06-19 ENCOUNTER — Inpatient Hospital Stay

## 2024-06-19 LAB — CBC (CANCER CENTER ONLY)
HCT: 40.5 % (ref 39.0–52.0)
Hemoglobin: 14.9 g/dL (ref 13.0–17.0)
MCH: 36.2 pg — ABNORMAL HIGH (ref 26.0–34.0)
MCHC: 36.8 g/dL — ABNORMAL HIGH (ref 30.0–36.0)
MCV: 98.3 fL (ref 80.0–100.0)
Platelet Count: 185 K/uL (ref 150–400)
RBC: 4.12 MIL/uL — ABNORMAL LOW (ref 4.22–5.81)
RDW: 11.8 % (ref 11.5–15.5)
WBC Count: 8.2 K/uL (ref 4.0–10.5)
nRBC: 0 % (ref 0.0–0.2)

## 2024-06-19 LAB — IRON AND TIBC
Iron: 199 ug/dL — ABNORMAL HIGH (ref 45–182)
Saturation Ratios: 82 % — ABNORMAL HIGH (ref 17.9–39.5)
TIBC: 242 ug/dL — ABNORMAL LOW (ref 250–450)
UIBC: 43 ug/dL

## 2024-06-19 LAB — FERRITIN: Ferritin: 597 ng/mL — ABNORMAL HIGH (ref 24–336)

## 2024-06-19 NOTE — Patient Instructions (Signed)

## 2024-06-19 NOTE — Progress Notes (Signed)
 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 His past medical history is significant for ADHD and anxiety. He has symptoms of epigastric pain and chest wall pain. He underwent a complete cardiology evaluation including a nuclear stress test which was unremarkable. 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 CMP showed elevated AST and ALT of 92 and 177 respectively with a total normal bilirubin. Iron studies showed ferritin levels greater than 1500 with an iron saturation of 99%. Hepatitis B and C testing were negative. CBC in the past has been normal. Patient drinks about 1 beer per day     Interval history-so far he is tolerating weekly phlebotomies well and has been doing for the last 1 year.  He would like to consider twice a week phlebotomy if he can tolerate it  ECOG PS- 0 Pain scale- 0   Review of systems- Review of Systems  Constitutional:  Negative for chills, fever, malaise/fatigue and weight loss.  HENT:  Negative for congestion, ear discharge and nosebleeds.   Eyes:  Negative for blurred vision.  Respiratory:  Negative for cough, hemoptysis, sputum production, shortness of breath and wheezing.   Cardiovascular:  Negative for chest pain, palpitations, orthopnea and claudication.  Gastrointestinal:  Negative for abdominal pain,  blood in stool, constipation, diarrhea, heartburn, melena, nausea and vomiting.  Genitourinary:  Negative for dysuria, flank pain, frequency, hematuria and urgency.  Musculoskeletal:  Negative for back pain, joint pain and myalgias.  Skin:  Negative for rash.  Neurological:  Negative for dizziness, tingling, focal weakness, seizures, weakness and headaches.  Endo/Heme/Allergies:  Does not bruise/bleed easily.  Psychiatric/Behavioral:  Negative for depression and suicidal ideas. The patient does not have insomnia.       Allergies  Allergen Reactions   Penicillins Other (See Comments) and Rash    Unknown reaction. Had as a child  unknown  unknown  Unknown reaction. Had as a child  unknown  Other Reaction(s): Not available     Past Medical History:  Diagnosis Date   ADHD    Anxiety    Bronchitis    GERD (gastroesophageal reflux disease)    Hemochromatosis    History of kidney stones      Past Surgical History:  Procedure Laterality Date   BELPHAROPTOSIS REPAIR     EXTRACORPOREAL SHOCK WAVE LITHOTRIPSY  2013   EXTRACORPOREAL SHOCK WAVE LITHOTRIPSY Right 01/21/2024   Procedure: LITHOTRIPSY, ESWL;  Surgeon: Gaston Hamilton, MD;  Location: [facility];  Service: Urology;  Laterality: Right;  RIGHT EXTRACORPOREAL SHOCKWAVE LITHOTRIPSY   GANGLION CYST EXCISION Left 10/17/2020   Procedure: REMOVAL GANGLION OF WRIST;  Surgeon: Cleotilde Barrio, MD;  Location: [facility];  Service: Orthopedics;  Laterality: Left;    Social History   Socioeconomic History   Marital status: Married    Spouse name: Not on file   Number of children: 2   Years of education: Not on file  Highest education level: Not on file  Occupational History   Not on file  Tobacco Use   Smoking status: Never   Smokeless tobacco: Current    Types: Snuff  Vaping Use   Vaping status: Never Used  Substance and Sexual Activity   Alcohol use: Yes    Alcohol/week: 6.0 standard drinks of alcohol    Types: 6 Cans of  beer per week    Comment: social   Drug use: No   Sexual activity: Yes  Other Topics Concern   Not on file  Social History Narrative   Not on file   Social Drivers of Health   Financial Resource Strain: Low Risk  (02/22/2024)   Received from [facility]   Overall Financial Resource Strain (CARDIA)    Difficulty of Paying Living Expenses: Not very hard  Food Insecurity: No Food Insecurity (02/22/2024)   Received from [facility]   Hunger Vital Sign    Within the past 12 months, you worried that your food would run out before you got the money to buy more.: Never true    Within the past 12 months, the food you bought just didn't last and you didn't have money to get more.: Never true  Transportation Needs: No Transportation Needs (02/22/2024)   Received from [facility] - Transportation    In the past 12 months, has lack of transportation kept you from medical appointments or from getting medications?: No    Lack of Transportation (Non-Medical): No  Physical Activity: Not on file  Stress: Not on file  Social Connections: Not on file  Intimate Partner Violence: Not At Risk (06/29/2023)   Humiliation, Afraid, Rape, and Kick questionnaire    Fear of Current or Ex-Partner: No    Emotionally Abused: No    Physically Abused: No    Sexually Abused: No    No family history on file.   Current Outpatient Medications:    cefdinir  (OMNICEF ) 300 MG capsule, Take 1 capsule (300 mg total) by mouth 2 (two) times daily., Disp: 20 capsule, Rfl: 0   cetirizine  (ZYRTEC  ALLERGY) 10 MG tablet, Take 1 tablet (10 mg total) by mouth daily., Disp: 30 tablet, Rfl: 0   pantoprazole (PROTONIX) 40 MG tablet, Take 40 mg by mouth daily., Disp: , Rfl:    propranolol ER (INDERAL LA) 60 MG 24 hr capsule, Take 1 tablet by mouth daily., Disp: , Rfl:    tamsulosin  (FLOMAX ) 0.4 MG CAPS capsule, Take 1 capsule (0.4 mg total) by mouth daily., Disp: 21  capsule, Rfl: 1   famotidine  (PEPCID ) 40 MG tablet, Take 1 tablet by mouth at bedtime. (Patient not taking: Reported on 03/17/2024), Disp: , Rfl:    HYDROcodone -acetaminophen  (NORCO/VICODIN) 5-325 MG tablet, Take 1-2 tablets by mouth every 6 (six) hours as needed for moderate pain (pain score 4-6). (Patient not taking: Reported on 03/17/2024), Disp: 14 tablet, Rfl: 0   ibuprofen (ADVIL) 400 MG tablet, Take 400 mg by mouth 2 (two) times daily as needed. (Patient not taking: Reported on 03/17/2024), Disp: , Rfl:    lisdexamfetamine (VYVANSE) 50 MG capsule, Take by mouth. (Patient not taking: Reported on 03/17/2024), Disp: , Rfl:    oxyCODONE-acetaminophen  (PERCOCET/ROXICET) 5-325 MG tablet, Take 1 tablet by mouth every 6 (six) hours as needed for severe pain (pain score 7-10). (Patient not taking: Reported on 03/17/2024), Disp: , Rfl:    predniSONE  (DELTASONE ) 20 MG tablet, Take 2 tablets daily with  breakfast. (Patient not taking: Reported on 06/19/2024), Disp: 10 tablet, Rfl: 0   sucralfate  (CARAFATE ) 1 g tablet, Take 1 tablet (1 g total) by mouth 4 (four) times daily. (Patient not taking: Reported on 03/17/2024), Disp: 120 tablet, Rfl: 1  Physical exam: There were no vitals filed for this visit. Physical Exam Cardiovascular:     Rate and Rhythm: Normal rate and regular rhythm.     Heart sounds: Normal heart sounds.  Pulmonary:     Effort: Pulmonary effort is normal.     Breath sounds: Normal breath sounds.  Skin:    General: Skin is warm and dry.  Neurological:     Mental Status: He is alert and oriented to person, place, and time.      I have personally reviewed labs listed below:    Latest Ref Rng & Units 03/17/2024    2:03 PM  CMP  Glucose 70 - 99 mg/dL 856   BUN 6 - 20 mg/dL 13   Creatinine 9.38 - 1.24 mg/dL 9.19   Sodium 864 - 854 mmol/L 137   Potassium 3.5 - 5.1 mmol/L 3.5   Chloride 98 - 111 mmol/L 108   CO2 22 - 32 mmol/L 22   Calcium 8.9 - 10.3 mg/dL 8.7   Total Protein 6.5 -  8.1 g/dL 6.8   Total Bilirubin 0.0 - 1.2 mg/dL 0.9   Alkaline Phos 38 - 126 U/L 78   AST 15 - 41 U/L 42   ALT 0 - 44 U/L 75       Latest Ref Rng & Units 06/19/2024    2:46 PM  CBC  WBC 4.0 - 10.5 K/uL 8.2   Hemoglobin 13.0 - 17.0 g/dL 85.0   Hematocrit 60.9 - 52.0 % 40.5   Platelets 150 - 400 K/uL 185    I have personally reviewed Radiology images listed below: No images are attached to the encounter.  MR THORACIC SPINE WO CONTRAST Result Date: 06/05/2024 CLINICAL DATA:  Pain in upper back EXAM: MRI THORACIC SPINE WITHOUT CONTRAST TECHNIQUE: Multiplanar, multisequence MR imaging of the thoracic spine was performed. No intravenous contrast was administered. COMPARISON:  None Available. FINDINGS: Alignment:  Physiologic. Vertebrae: No fracture, evidence of discitis, or bone lesion. Cord:  Normal signal and morphology. Paraspinal and other soft tissues: Negative. Disc levels: Multilevel disc desiccation and bulging. Mild canal stenoses at T4-T5, T6-T7, T7-T8, T8-T9, and T9-T10 secondary to disc bulging and protrusions. Moderate foraminal stenoses bilaterally at T1-T2 through T5-T6 secondary to disc bulging and facet arthropathy. IMPRESSION: 1. Multilevel disc desiccation and bulging. Mild canal stenoses at T4-T5, T6-T7, T7-T8, T8-T9, and T9-T10 secondary to disc bulging and protrusions. 2. Moderate foraminal stenoses bilaterally at T1-T2 through T5-T6 secondary to disc bulging and facet arthropathy. Electronically Signed   By: Clem Savory M.D.   On: 06/05/2024 13:09     Assessment and plan- Patient is a 48 y.o. 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  He will continue with weekly phlebotomy along with H&H at this time.  Ferritin levels to be checked every 3 weeks.  Goal is to get his ferritin level less than 100.  He is going  to try twice a week phlebotomy this week and if he tolerates it well we will continue with that plan.  Labs only once a week.  I will  see him back in 3 months   Visit Diagnosis 1. Hereditary hemochromatosis   2. Iron overload      Dr. Annah Skene, MD, MPH [facility] [phone] 06/19/2024 3:01 PM

## 2024-06-19 NOTE — Progress Notes (Signed)
 Kenneth Gibson presents today for phlebotomy per MD orders. Phlebotomy procedure started at 1345 and ended at 1345. 500 mls removed. Patient tolerated procedure well. IV needle removed intact.

## 2024-06-22 ENCOUNTER — Inpatient Hospital Stay: Attending: Oncology

## 2024-06-22 MED ORDER — SODIUM CHLORIDE 0.9 % IV SOLN
Freq: Once | INTRAVENOUS | Status: AC | PRN
  Filled 2024-06-22: qty 250

## 2024-06-22 NOTE — Progress Notes (Signed)
 Kenneth Gibson presents today for phlebotomy per MD orders. Phlebotomy procedure started at 0925 and ended at 0940. 500 mls removed. Patient tolerated procedure well. Normal Saline bolus given after Phlebotomy per patient request.   IV needle removed intact.

## 2024-06-22 NOTE — Patient Instructions (Signed)

## 2024-07-02 ENCOUNTER — Encounter: Payer: Self-pay | Admitting: Emergency Medicine

## 2024-07-02 ENCOUNTER — Ambulatory Visit
Admission: EM | Admit: 2024-07-02 | Discharge: 2024-07-02 | Disposition: A | Discharge status: Home / Self Care | Attending: Emergency Medicine | Admitting: Emergency Medicine

## 2024-07-02 DIAGNOSIS — J020 Streptococcal pharyngitis: Secondary | ICD-10-CM | POA: Diagnosis not present

## 2024-07-02 LAB — POCT RAPID STREP A (OFFICE): Rapid Strep A Screen: POSITIVE — AB

## 2024-07-02 MED ORDER — CEFDINIR 300 MG PO CAPS: 300.0000 mg | ORAL_CAPSULE | Freq: Two times a day (BID) | ORAL | 0 refills | Status: DC

## 2024-07-02 NOTE — Discharge Instructions (Addendum)
 Your rapid strep test today was positive  Take cefdinir  twice a day for the next 7 days, daily will see improvement in about 48 hours and steady progression from there  may use of salt gargles throat lozenges, warm liquids, teaspoons of honey and over-the-counter clippers septic spray for comfort  May give Tylenol  or Motrin every 6 hours as needed for additional comfort  You may follow-up at urgent care as needed

## 2024-07-02 NOTE — ED Triage Notes (Signed)
 Patient complains sore throat and hoarse for 3 days. Patient has taken benadryl  and Chloraseptic spray with mild relief. Rates pain 7/10.

## 2024-07-02 NOTE — ED Provider Notes (Addendum)
 [facility]    CSN: [id number] Arrival date & time: 07/02/24  1005      History   Chief Complaint Chief Complaint  Patient presents with   Sore Throat    HPI Kenneth Gibson is a 48 y.o. male.   Patient presents for evaluation of a sore throat, hoarseness, intermittent nasal congestion and a mild nonproductive cough beginning 3 days ago.  Painful to swallow but tolerable to some food and liquids.  Has attempted use of Benadryl  and over-the-counter Chloraseptic spray.  Denies presence of fever, shortness of breath or wheezing.  Past Medical History:  Diagnosis Date   ADHD    Anxiety    Bronchitis    GERD (gastroesophageal reflux disease)    Hemochromatosis    History of kidney stones     Patient Active Problem List   Diagnosis Date Noted   Hereditary hemochromatosis 06/29/2023    Past Surgical History:  Procedure Laterality Date   BELPHAROPTOSIS REPAIR     EXTRACORPOREAL SHOCK WAVE LITHOTRIPSY  2013   EXTRACORPOREAL SHOCK WAVE LITHOTRIPSY Right 01/21/2024   Procedure: LITHOTRIPSY, ESWL;  Surgeon: Gaston Hamilton, MD;  Location: [facility];  Service: Urology;  Laterality: Right;  RIGHT EXTRACORPOREAL SHOCKWAVE LITHOTRIPSY   GANGLION CYST EXCISION Left 10/17/2020   Procedure: REMOVAL GANGLION OF WRIST;  Surgeon: Cleotilde Barrio, MD;  Location: [facility];  Service: Orthopedics;  Laterality: Left;       Home Medications    Prior to Admission medications   Medication Sig Start Date End Date Taking? Authorizing Provider  cefdinir  (OMNICEF ) 300 MG capsule Take 1 capsule (300 mg total) by mouth 2 (two) times daily. 07/02/24  Yes Demontre Padin R, NP  cetirizine  (ZYRTEC  ALLERGY) 10 MG tablet Take 1 tablet (10 mg total) by mouth daily. 06/07/24   Cedrik Savannah, PA-C  HYDROcodone -acetaminophen  (NORCO/VICODIN) 5-325 MG tablet Take 1-2 tablets by mouth every 6 (six) hours as needed for moderate pain (pain score 4-6). Patient not taking: Reported on 03/17/2024  01/21/24   MacDiarmid, Scott, MD  ibuprofen (ADVIL) 400 MG tablet Take 400 mg by mouth 2 (two) times daily as needed. Patient not taking: Reported on 03/17/2024    [provider]  lisdexamfetamine (VYVANSE) 50 MG capsule Take by mouth. Patient not taking: Reported on 03/17/2024 09/15/23 11/17/23  [provider]  oxyCODONE-acetaminophen  (PERCOCET/ROXICET) 5-325 MG tablet Take 1 tablet by mouth every 6 (six) hours as needed for severe pain (pain score 7-10). Patient not taking: Reported on 03/17/2024    [provider]  pantoprazole (PROTONIX) 40 MG tablet Take 40 mg by mouth daily. 09/08/23   [provider]  predniSONE  (DELTASONE ) 20 MG tablet Take 2 tablets daily with breakfast. Patient not taking: Reported on 06/19/2024 06/07/24   Dent Savannah, PA-C  propranolol ER (INDERAL LA) 60 MG 24 hr capsule Take 1 tablet by mouth daily. 09/30/23   [provider]  sucralfate  (CARAFATE ) 1 g tablet Take 1 tablet (1 g total) by mouth 4 (four) times daily. Patient not taking: Reported on 03/17/2024 01/09/24 03/09/24  Levander Slate, MD  tamsulosin  (FLOMAX ) 0.4 MG CAPS capsule Take 1 capsule (0.4 mg total) by mouth daily. 01/21/24   Gaston Hamilton, MD    Family History History reviewed. No pertinent family history.  Social History Social History   Tobacco Use   Smoking status: Never   Smokeless tobacco: Current    Types: Snuff  Vaping Use   Vaping status: Never Used  Substance Use Topics  Alcohol use: Yes    Alcohol/week: 6.0 standard drinks of alcohol    Types: 6 Cans of beer per week    Comment: social   Drug use: No     Allergies   Penicillins   Review of Systems Review of Systems   Physical Exam Triage Vital Signs ED Triage Vitals  Encounter Vitals Group     BP 07/02/24 1017 127/86     Girls Systolic BP Percentile --      Girls Diastolic BP Percentile --      Boys Systolic BP Percentile --      Boys Diastolic BP Percentile --      Pulse Rate  07/02/24 1017 76     Resp 07/02/24 1017 18     Temp 07/02/24 1017 98.6 F (37 C)     Temp Source 07/02/24 1017 Oral     SpO2 07/02/24 1017 97 %     Weight --      Height --      Head Circumference --      Peak Flow --      Pain Score 07/02/24 1020 7     Pain Loc --      Pain Education --      Exclude from Growth Chart --    No data found.  Updated Vital Signs BP 127/86 (BP Location: Right Arm)   Pulse 76   Temp 98.6 F (37 C) (Oral)   Resp 18   SpO2 97%   Visual Acuity Right Eye Distance:   Left Eye Distance:   Bilateral Distance:    Right Eye Near:   Left Eye Near:    Bilateral Near:     Physical Exam Constitutional:      Appearance: He is well-developed.  HENT:     Right Ear: Tympanic membrane and ear canal normal.     Left Ear: Tympanic membrane and ear canal normal.     Nose: Congestion present.     Mouth/Throat:     Pharynx: Posterior oropharyngeal erythema present. No oropharyngeal exudate.  Musculoskeletal:     Cervical back: Normal range of motion and neck supple.  Neurological:     Mental Status: He is alert and oriented to person, place, and time.      UC Treatments / Results  Labs (all labs ordered are listed, but only abnormal results are displayed) Labs Reviewed  POCT RAPID STREP A (OFFICE) - Abnormal; Notable for the following components:      Result Value   Rapid Strep A Screen Positive (*)    All other components within normal limits    EKG   Radiology No results found.  Procedures Procedures (including critical care time)  Medications Ordered in UC Medications - No data to display  Initial Impression / Assessment and Plan / UC Course  I have reviewed the triage vital signs and the nursing notes.  Pertinent labs & imaging results that were available during my care of the patient were reviewed by me and considered in my medical decision making (see chart for details).  Strep pharyngitis  Rapid testing positive, discussed  with patient, cefdinir  prescribed, may attempt salt water gargles, throat lozenges, warm to cool liquids per preference, over-the-counter Chloraseptic spray and over-the-counter analgesics for management of discomfort, may follow-up with urgent care as needed if symptoms persist or worsen,  Final Clinical Impressions(s) / UC Diagnoses   Final diagnoses:  Strep pharyngitis     Discharge Instructions  Your rapid strep test today was positive  Take cefdinir  twice a day for the next 7 days, daily will see improvement in about 48 hours and steady progression from there  may use of salt gargles throat lozenges, warm liquids, teaspoons of honey and over-the-counter clippers septic spray for comfort  May give Tylenol  or Motrin every 6 hours as needed for additional comfort  You may follow-up at urgent care as needed      ED Prescriptions     Medication Sig Dispense Auth. Provider   cefdinir  (OMNICEF ) 300 MG capsule Take 1 capsule (300 mg total) by mouth 2 (two) times daily. 10 capsule Arian Mcquitty R, NP      PDMP not reviewed this encounter.   Teresa Shelba SAUNDERS, NP 07/02/24 1102    Teresa Shelba R, [place] 07/02/24 1103

## 2024-08-01 ENCOUNTER — Telehealth: Payer: Self-pay | Admitting: Oncology

## 2024-08-01 NOTE — Telephone Encounter (Signed)
 Pt calling and wants to schedule his lab once a week and phlebotomy twice a week. The pt states he had a procedure  done and he's been sick and has missed his appts for 3 or 4 weeks and would like to be put back on the schedule. Please advise on scheduling.

## 2024-08-01 NOTE — Telephone Encounter (Signed)
 Can you please schedule him for H&H once a week, phlebotomy twice a week and ferritin to be checked every 2 weeks?

## 2024-08-04 ENCOUNTER — Other Ambulatory Visit: Payer: Self-pay

## 2024-08-04 ENCOUNTER — Inpatient Hospital Stay

## 2024-08-04 ENCOUNTER — Inpatient Hospital Stay: Attending: Oncology

## 2024-08-04 LAB — HEMOGLOBIN AND HEMATOCRIT (CANCER CENTER ONLY)
HCT: 43.6 % (ref 39.0–52.0)
Hemoglobin: 15.7 g/dL (ref 13.0–17.0)

## 2024-08-04 MED ORDER — SODIUM CHLORIDE 0.9 % IV SOLN
Freq: Once | INTRAVENOUS | Status: DC | PRN
  Filled 2024-08-04: qty 250

## 2024-09-08 ENCOUNTER — Inpatient Hospital Stay: Attending: Oncology

## 2024-09-08 ENCOUNTER — Inpatient Hospital Stay

## 2024-09-08 LAB — HEMOGLOBIN AND HEMATOCRIT (CANCER CENTER ONLY)
HCT: 40.7 % (ref 39.0–52.0)
Hemoglobin: 14.7 g/dL (ref 13.0–17.0)

## 2024-09-08 MED ORDER — SODIUM CHLORIDE 0.9 % IV SOLN
Freq: Once | INTRAVENOUS | Status: AC | PRN
  Filled 2024-09-08: qty 250

## 2024-09-08 NOTE — Patient Instructions (Signed)

## 2024-09-08 NOTE — Progress Notes (Signed)
 Kenneth Gibson presents today for phlebotomy per MD orders. Phlebotomy procedure started at 0938 and ended at 0948. 500 mls removed. Patient tolerated procedure well. IV needle removed intact.

## 2024-09-12 ENCOUNTER — Inpatient Hospital Stay

## 2024-09-12 LAB — HEMOGLOBIN AND HEMATOCRIT (CANCER CENTER ONLY)
HCT: 40.6 % (ref 39.0–52.0)
Hemoglobin: 14.8 g/dL (ref 13.0–17.0)

## 2024-09-12 LAB — FERRITIN: Ferritin: 1235 ng/mL — ABNORMAL HIGH (ref 24–336)

## 2024-09-12 MED ORDER — SODIUM CHLORIDE 0.9 % IV SOLN
Freq: Once | INTRAVENOUS | Status: AC | PRN
  Filled 2024-09-12: qty 250

## 2024-09-12 NOTE — Progress Notes (Signed)
 Kenneth Gibson presents today for phlebotomy per MD orders. Phlebotomy procedure started at 1122 and ended at 1140. 500 mls removed. Patient tolerated procedure well. Normal Saline Bolus of 500 mls given after per pt request.  IV needle removed intact.

## 2024-09-12 NOTE — Patient Instructions (Signed)

## 2024-09-18 ENCOUNTER — Encounter: Payer: Self-pay | Admitting: Oncology

## 2024-09-18 ENCOUNTER — Inpatient Hospital Stay

## 2024-09-18 ENCOUNTER — Inpatient Hospital Stay: Admitting: Oncology

## 2024-09-18 LAB — CBC WITH DIFFERENTIAL (CANCER CENTER ONLY)
Abs Immature Granulocytes: 0.03 K/uL (ref 0.00–0.07)
Basophils Absolute: 0 K/uL (ref 0.0–0.1)
Basophils Relative: 1 %
Eosinophils Absolute: 0.2 K/uL (ref 0.0–0.5)
Eosinophils Relative: 2 %
HCT: 37.8 % — ABNORMAL LOW (ref 39.0–52.0)
Hemoglobin: 13.6 g/dL (ref 13.0–17.0)
Immature Granulocytes: 0 %
Lymphocytes Relative: 30 %
Lymphs Abs: 2.2 K/uL (ref 0.7–4.0)
MCH: 35.5 pg — ABNORMAL HIGH (ref 26.0–34.0)
MCHC: 36 g/dL (ref 30.0–36.0)
MCV: 98.7 fL (ref 80.0–100.0)
Monocytes Absolute: 0.6 K/uL (ref 0.1–1.0)
Monocytes Relative: 7 %
Neutro Abs: 4.5 K/uL (ref 1.7–7.7)
Neutrophils Relative %: 60 %
Platelet Count: 201 K/uL (ref 150–400)
RBC: 3.83 MIL/uL — ABNORMAL LOW (ref 4.22–5.81)
RDW: 13 % (ref 11.5–15.5)
WBC Count: 7.6 K/uL (ref 4.0–10.5)
nRBC: 0 % (ref 0.0–0.2)

## 2024-09-18 LAB — CMP (CANCER CENTER ONLY)
ALT: 80 U/L — ABNORMAL HIGH (ref 0–44)
AST: 46 U/L — ABNORMAL HIGH (ref 15–41)
Albumin: 4.4 g/dL (ref 3.5–5.0)
Alkaline Phosphatase: 89 U/L (ref 38–126)
Anion gap: 10 (ref 5–15)
BUN: 10 mg/dL (ref 6–20)
CO2: 25 mmol/L (ref 22–32)
Calcium: 9.4 mg/dL (ref 8.9–10.3)
Chloride: 105 mmol/L (ref 98–111)
Creatinine: 0.8 mg/dL (ref 0.61–1.24)
GFR, Estimated: >60 mL/min
Glucose, Bld: 88 mg/dL (ref 70–99)
Potassium: 4 mmol/L (ref 3.5–5.1)
Sodium: 141 mmol/L (ref 135–145)
Total Bilirubin: 0.7 mg/dL (ref 0.0–1.2)
Total Protein: 7.3 g/dL (ref 6.5–8.1)

## 2024-09-18 LAB — FERRITIN: Ferritin: 1039 ng/mL — ABNORMAL HIGH (ref 24–336)

## 2024-09-18 MED ORDER — SODIUM CHLORIDE 0.9 % IV SOLN
Freq: Once | INTRAVENOUS | Status: AC | PRN
  Filled 2024-09-18: qty 250

## 2024-09-18 NOTE — Progress Notes (Signed)
 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 His past medical history is significant for ADHD and anxiety. He has symptoms of epigastric pain and chest wall pain. He underwent a complete cardiology evaluation including a nuclear stress test which was unremarkable. 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 CMP showed elevated AST and ALT of 92 and 177 respectively with a total normal bilirubin. Iron studies showed ferritin levels greater than 1500 with an iron saturation of 99%. Hepatitis B and C testing were negative. CBC in the past has been normal. Patient drinks about 1 beer per day   Interval history- Kenneth Gibson is a 48 year old male with hereditary hemochromatosis who presents for hematology follow-up and management of elevated ferritin.  He is undergoing regular phlebotomy for hereditary hemochromatosis. Three months ago, his ferritin was 597 ng/mL; six days ago, it increased to 1235 ng/mL. He missed several phlebotomy sessions in recent weeks due to travel and an episode of illness.  He experienced streptococcal pharyngitis approximately one month ago. He denies new symptoms attributable to iron overload or other acute complaints.     ECOG PS- 0 Pain scale- 0   Review of systems- Review of Systems  Constitutional:  Negative for  chills, fever, malaise/fatigue and weight loss.  HENT:  Negative for congestion, ear discharge and nosebleeds.   Eyes:  Negative for blurred vision.  Respiratory:  Negative for cough, hemoptysis, sputum production, shortness of breath and wheezing.   Cardiovascular:  Negative for chest pain, palpitations, orthopnea and claudication.  Gastrointestinal:  Negative for abdominal pain, blood in stool, constipation, diarrhea, heartburn, melena, nausea and vomiting.  Genitourinary:  Negative for dysuria, flank pain, frequency, hematuria and urgency.  Musculoskeletal:  Negative for back pain, joint pain and myalgias.  Skin:  Negative for rash.  Neurological:  Negative for dizziness, tingling, focal weakness, seizures, weakness and headaches.  Endo/Heme/Allergies:  Does not bruise/bleed easily.  Psychiatric/Behavioral:  Negative for depression and suicidal ideas. The patient does not have insomnia.       Allergies[1]   Past Medical History:  Diagnosis Date   ADHD    Anxiety    Bronchitis    GERD (gastroesophageal reflux disease)    Hemochromatosis    History of kidney stones      Past Surgical History:  Procedure Laterality Date   BELPHAROPTOSIS REPAIR     EXTRACORPOREAL SHOCK WAVE LITHOTRIPSY  2013   EXTRACORPOREAL SHOCK WAVE LITHOTRIPSY Right 01/21/2024   Procedure: LITHOTRIPSY, ESWL;  Surgeon: Gaston Hamilton, MD;  Location: [facility];  Service: Urology;  Laterality: Right;  RIGHT EXTRACORPOREAL SHOCKWAVE LITHOTRIPSY   GANGLION CYST EXCISION Left 10/17/2020   Procedure: REMOVAL GANGLION OF WRIST;  Surgeon: Cleotilde Barrio, MD;  Location: [facility] ORS;  Service: Orthopedics;  Laterality: Left;    Social History   Socioeconomic History   Marital status: Married    Spouse name: Not on file   Number of children:  2   Years of education: Not on file   Highest education level: Not on file  Occupational History   Not on file  Tobacco Use   Smoking status: Never   Smokeless tobacco:  Current    Types: Snuff  Vaping Use   Vaping status: Never Used  Substance and Sexual Activity   Alcohol use: Yes    Alcohol/week: 6.0 standard drinks of alcohol    Types: 6 Cans of beer per week    Comment: social   Drug use: No   Sexual activity: Yes  Other Topics Concern   Not on file  Social History Narrative   Not on file   Social Drivers of Health   Tobacco Use: High Risk (09/18/2024)   Patient History    Smoking Tobacco Use: Never    Smokeless Tobacco Use: Current    Passive Exposure: Not on file  Financial Resource Strain: Low Risk  (08/22/2024)   Received from [facility]   Overall Financial Resource Strain (CARDIA)    Difficulty of Paying Living Expenses: Not hard at all  Food Insecurity: No Food Insecurity (08/22/2024)   Received from [facility]   Epic    Within the past 12 months, you worried that your food would run out before you got the money to buy more.: Never true    Within the past 12 months, the food you bought just didn't last and you didn't have money to get more.: Never true  Transportation Needs: No Transportation Needs (08/22/2024)   Received from [facility] - Transportation    In the past 12 months, has lack of transportation kept you from medical appointments or from getting medications?: No    Lack of Transportation (Non-Medical): No  Physical Activity: Not on file  Stress: Not on file  Social Connections: Not on file  Intimate Partner Violence: Not At Risk (06/29/2023)   Humiliation, Afraid, Rape, and Kick questionnaire    Fear of Current or Ex-Partner: No    Emotionally Abused: No    Physically Abused: No    Sexually Abused: No  Depression (PHQ2-9): Low Risk (09/18/2024)   Depression (PHQ2-9)    PHQ-2 Score: 0  Alcohol Screen: Not on file  Housing: Low Risk  (08/22/2024)   Received from [facility]   Epic    In the last 12 months, was there a time when you  were not able to pay the mortgage or rent on time?: No    In the past 12 months, how many times have you moved where you were living?: 0    At any time in the past 12 months, were you homeless or living in a shelter (including now)?: No  Utilities: Not At Risk (08/22/2024)   Received from [facility]   Epic    In the past 12 months has the electric, gas, oil, or water company threatened to shut off services in your home?: No  Health Literacy: Not on file    History reviewed. No pertinent family history.  Current Medications[2]  Physical exam:  Vitals:   09/18/24 1425  BP: 119/82  Pulse: 79  Temp: 98.3 F (36.8 C)  TempSrc: Tympanic  SpO2: 99%  Weight: 246 lb (111.6 kg)  Height: 5' 10 (1.778 m)   Physical Exam Cardiovascular:     Rate and Rhythm: Normal rate and regular rhythm.     Heart sounds: Normal heart sounds.  Pulmonary:     Effort: Pulmonary effort is normal.     Breath sounds: Normal breath sounds.  Skin:    General: Skin is warm and dry.  Neurological:     Mental Status: He is alert and oriented to person, place, and time.      I have personally reviewed labs listed below:    Latest Ref Rng & Units 03/17/2024    2:03 PM  CMP  Glucose 70 - 99 mg/dL 856   BUN 6 - 20 mg/dL 13   Creatinine 9.38 - 1.24 mg/dL 9.19   Sodium 864 - 854 mmol/L 137   Potassium 3.5 - 5.1 mmol/L 3.5   Chloride 98 - 111 mmol/L 108   CO2 22 - 32 mmol/L 22   Calcium 8.9 - 10.3 mg/dL 8.7   Total Protein 6.5 - 8.1 g/dL 6.8   Total Bilirubin 0.0 - 1.2 mg/dL 0.9   Alkaline Phos 38 - 126 U/L 78   AST 15 - 41 U/L 42   ALT 0 - 44 U/L 75       Latest Ref Rng & Units 09/18/2024    2:34 PM  CBC  WBC 4.0 - 10.5 K/uL 7.6   Hemoglobin 13.0 - 17.0 g/dL 86.3   Hematocrit 60.9 - 52.0 % 37.8   Platelets 150 - 400 K/uL 201      Assessment and plan- Patient is a 48 y.o. male here for routine follow-up of hereditary hemochromatosis for possible phlebotomy  Assessment and  Plan    Hereditary hemochromatosis Chronic iron overload with ferritin increased to 1235 ng/mL, likely due to recent infection and missed phlebotomy. Target ferritin <100 ng/mL. Resumed intensified phlebotomy. - Performed phlebotomy today.  Continue with weekly phlebotomies or twice a week if tolerated.  H&H each week.  Ferritin levels to be checked every 2 weeks.  I will see him back in 3 months.  Baseline CT does not show any evidence of cirrhosis and therefore he does not require any HCC surveillance at this time         Visit Diagnosis 1. Hereditary hemochromatosis      Dr. Annah Skene, MD, MPH [facility] [phone] 09/18/2024 3:09 PM                   [1]  Allergies Allergen Reactions   Penicillins Other (See Comments) and Rash    Unknown reaction. Had as a child  unknown  unknown  Unknown reaction. Had as a child  unknown  Other Reaction(s): Not available  [2]  Current Outpatient Medications:    cetirizine  (ZYRTEC  ALLERGY) 10 MG tablet, Take 1 tablet (10 mg total) by mouth daily., Disp: 30 tablet, Rfl: 0   pantoprazole (PROTONIX) 40 MG tablet, Take 40 mg by mouth daily., Disp: , Rfl:    phentermine 37.5 MG capsule, Take 37.5 mg by mouth every morning., Disp: , Rfl:    propranolol ER (INDERAL LA) 60 MG 24 hr capsule, Take 1 tablet by mouth daily., Disp: , Rfl:    topiramate (TOPAMAX) 50 MG tablet, Take 50 mg by mouth 2 (two) times daily., Disp: , Rfl:   "

## 2024-10-17 ENCOUNTER — Telehealth: Payer: Self-pay | Admitting: Oncology

## 2024-10-17 ENCOUNTER — Other Ambulatory Visit: Payer: Self-pay

## 2024-10-17 NOTE — Telephone Encounter (Signed)
 He needs to continue with H/H and weekly phlebotomy for 8 more sessions until he sees me in march. Check ferritin Q3 weeks

## 2024-10-17 NOTE — Telephone Encounter (Signed)
 Pt left voicemail that he needs to schedule his follow-up appts from Dec. Please call patient to get him scheduled.

## 2024-10-19 ENCOUNTER — Inpatient Hospital Stay

## 2024-10-19 ENCOUNTER — Inpatient Hospital Stay: Attending: Oncology

## 2024-10-19 ENCOUNTER — Other Ambulatory Visit: Payer: Self-pay | Admitting: Oncology

## 2024-10-19 LAB — FERRITIN: Ferritin: 1003 ng/mL — ABNORMAL HIGH (ref 24–336)

## 2024-10-19 LAB — HEMOGLOBIN AND HEMATOCRIT (CANCER CENTER ONLY)
HCT: 43.8 % (ref 39.0–52.0)
Hemoglobin: 15.7 g/dL (ref 13.0–17.0)

## 2024-10-19 MED ORDER — SODIUM CHLORIDE 0.9 % IV SOLN
INTRAVENOUS | Status: AC
  Filled 2024-10-19 (×2): qty 250

## 2024-10-19 NOTE — Patient Instructions (Signed)

## 2024-10-19 NOTE — Progress Notes (Signed)
 Kenneth Gibson presents today for phlebotomy per MD orders. Phlebotomy procedure started at 0850 and ended at 0913. 500 mls removed. Patient tolerated procedure well. IV needle removed intact.

## 2024-10-22 ENCOUNTER — Telehealth: Payer: Self-pay | Admitting: Oncology

## 2024-10-22 NOTE — Telephone Encounter (Signed)
 Due to weather, clinic is closed on 2/2. I called and lvm for pt to call back to r/s appts. Scheduling phone number provided

## 2024-10-23 ENCOUNTER — Inpatient Hospital Stay

## 2024-10-23 ENCOUNTER — Inpatient Hospital Stay: Attending: Oncology

## 2024-10-27 ENCOUNTER — Inpatient Hospital Stay

## 2024-10-27 MED ORDER — SODIUM CHLORIDE 0.9 % IV SOLN
INTRAVENOUS | Status: AC
  Filled 2024-10-27 (×2): qty 250

## 2024-10-27 NOTE — Progress Notes (Signed)
 Kenneth Gibson presents today for phlebotomy per MD orders. Phlebotomy procedure started at 0915 and ended at 0937. 500 mls removed. Patient tolerated procedure well. IV needle removed intact.

## 2024-10-27 NOTE — Patient Instructions (Signed)

## 2024-10-30 ENCOUNTER — Inpatient Hospital Stay

## 2024-11-03 ENCOUNTER — Inpatient Hospital Stay

## 2024-12-18 ENCOUNTER — Inpatient Hospital Stay

## 2024-12-18 ENCOUNTER — Inpatient Hospital Stay: Admitting: Oncology
